# Patient Record
Sex: Male | Born: 2000 | Race: Black or African American | Hispanic: No | Marital: Single | Smoking: Never smoker
Health system: Southern US, Community
[De-identification: ages and names within clinical notes are randomized; demographics above are authoritative.]

## PROBLEM LIST (undated history)

## (undated) DIAGNOSIS — B20 Human immunodeficiency virus [HIV] disease: Secondary | ICD-10-CM

## (undated) DIAGNOSIS — J45909 Unspecified asthma, uncomplicated: Secondary | ICD-10-CM

## (undated) DIAGNOSIS — Z21 Asymptomatic human immunodeficiency virus [HIV] infection status: Secondary | ICD-10-CM

## (undated) HISTORY — DX: Asymptomatic human immunodeficiency virus (hiv) infection status: Z21

## (undated) HISTORY — DX: Human immunodeficiency virus (HIV) disease: B20

## (undated) HISTORY — DX: Unspecified asthma, uncomplicated: J45.909

---
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
## 2018-01-02 ENCOUNTER — Ambulatory Visit (INDEPENDENT_AMBULATORY_CARE_PROVIDER_SITE_OTHER): Payer: BLUE CROSS/BLUE SHIELD

## 2018-01-02 DIAGNOSIS — Z23 Encounter for immunization: Secondary | ICD-10-CM | POA: Diagnosis not present

## 2018-02-22 ENCOUNTER — Other Ambulatory Visit: Payer: Self-pay | Admitting: Infectious Diseases

## 2018-03-04 ENCOUNTER — Ambulatory Visit (INDEPENDENT_AMBULATORY_CARE_PROVIDER_SITE_OTHER): Payer: BLUE CROSS/BLUE SHIELD | Admitting: Infectious Diseases

## 2018-03-04 ENCOUNTER — Encounter: Payer: Self-pay | Admitting: Infectious Diseases

## 2018-03-04 VITALS — BP 120/76 | HR 61 | Temp 97.9°F | Wt 174.0 lb

## 2018-03-04 DIAGNOSIS — Z0000 Encounter for general adult medical examination without abnormal findings: Secondary | ICD-10-CM

## 2018-03-04 DIAGNOSIS — Z21 Asymptomatic human immunodeficiency virus [HIV] infection status: Secondary | ICD-10-CM

## 2018-03-04 DIAGNOSIS — Z23 Encounter for immunization: Secondary | ICD-10-CM | POA: Diagnosis not present

## 2018-03-04 NOTE — Assessment & Plan Note (Signed)
Jackson [name] was recently undetectable in April of this year. I congratulated him on this and offered him the opportunity to check labs today or defer until next visit - he would like to recheck them for his personal reassurance being new to HIV care which is understandable. Will check CD4 and VL today.  Counseled on adding [facility] 4[facility] (but we discussed he likely does not need one).  Discussed U=U concept in addition to safe sex counseling and prevention of other STIs. Encouraged good condom use and provided him with some today.  He will follow up again in 3 months with labs at the visit.

## 2018-03-04 NOTE — Addendum Note (Signed)
Addended by: Lurlean LeydenPOOLE, TRAVIS F on: 03/04/2018 01:58 PM   Modules accepted: Orders

## 2018-03-04 NOTE — Patient Instructions (Addendum)
If you want to add a multivitamin you can add this in the evening once a day - the only thing to be mindful of is separating by 4-6 hours from your biktarvy.   U=U for HIV sexual health.   Viral load was undetectable back in April - will recheck today. Medication is working perfectly and you are taking it very well. Keep up the good work!   CD4 count was 590 - this tells us that your immune system is doing well and is not at risk for any severe or life-threatening infections related to your HIV.   Will see you back in 3 months - if you sign up for your MyChart we can do labs at this visit so you don't need to come back for a extra lab appointment.   Please sign up with MyChart to access your labs and set up email communication with our clinic for non-urgent medical concerns.

## 2018-03-04 NOTE — Progress Notes (Signed)
Name: Jackson [name]  DOB: 08/19/2000  MRN: [id number] Patient, No Pcp Per   Patient Active Problem List   Diagnosis Date Noted  . Asthma in adult 08/23/2017  . Seasonal allergic rhinitis due to pollen 08/23/2017  . HIV (human immunodeficiency virus infection) (HCC) 08/20/2017  . Healthcare maintenance 08/20/2017  . Elevated AST (SGOT) 08/20/2017   Brief Narrative:  Jackson [name] is a 17 y.o. AA male with HIV infection. Originally diagnosed in 07/19/2017 after seeking STI testing. CD4 nadir 570. Also found to have chlamydia. Started on Biktarvy right away. History of OIs: none. HIV Risk: MSM.   Previous Regimens:   Biktarvy 2019  Genotype:   07/2017 - no mutations   SUBJECTIVE Chief Complaint  Patient presents with  . Follow-up   HPI/ROS: Jackson [name] is here wit his mother for routine follow up for his HIV. He has done well continuing to take his Biktarvy daily and missed no doses. No side effects noted. Reports no complaints today suggestive of associated opportunistic infection or advancing HIV disease such as fevers, night sweats, weight loss, anorexia, cough, SOB, nausea, vomiting, diarrhea, headache, sensory changes, lymphadenopathy or oral thrush. He has questions about sexual health going forward, vaccines and taking a multivitamin once a day, as his mother feels it is necessary. He has otherwise had no changes to his medical history and in the interval has been well. 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 His asthma has not caused him any trouble lately but has noticed some occasional "swelling of his lymph nodes around his throat" that always go back down. No other symptoms aside from mild cough or congestion at times.   Review of Systems  Constitutional: Negative for chills, fever, malaise/fatigue and weight loss.  HENT: Congestion: occasionally. Sore throat: occasionally         No dental problems.  Respiratory: Negative for  cough, sputum production, shortness of breath and wheezing.   Cardiovascular: Negative for chest pain and leg swelling.  Gastrointestinal: Negative for abdominal pain, diarrhea and vomiting.  Genitourinary: Negative for dysuria and flank pain.  Musculoskeletal: Negative for joint pain, myalgias and neck pain.  Skin: Negative for rash.  Neurological: Negative for dizziness, tingling and headaches.  Psychiatric/Behavioral: Negative for depression and substance abuse. The patient is not nervous/anxious and does not have insomnia.     Past Medical History:  Diagnosis Date  . Asthma   . HIV infection [facility])     Outpatient Medications Prior to Visit  Medication Sig Dispense Refill  . albuterol (PROVENTIL HFA;VENTOLIN HFA) 108 (90 Base) MCG/ACT inhaler Inhale 2 puffs into the lungs every 4 (four) hours as needed for wheezing or shortness of breath. 1 Inhaler 3  . BIKTARVY 50-200-25 MG TABS tablet TAKE 1 TABLET BY MOUTH DAILY. TRY TO TAKE AT THE SAME TIME EACH DAY WITH OR WITHOUT FOOD. 30 tablet 5  . cetirizine (ZYRTEC) 10 MG tablet Take 10 mg by mouth daily.    . fluticasone (FLOVENT HFA) 44 MCG/ACT inhaler Inhale 2 puffs into the lungs 2 (two) times daily. For 1-2 weeks or until breathing returns to baseline. 1 Inhaler 3  . ipratropium (ATROVENT) 0.03 % nasal spray Place 2 sprays into both nostrils 2 (two) times daily. As needed 30 mL 12   No facility-administered medications prior to visit.    Allergies  Allergen Reactions  . Amoxicillin Hives    Social History   Tobacco Use  . Smoking status:  Never Smoker  . Smokeless tobacco: Never Used  Substance Use Topics  . Alcohol use: No    Frequency: Never  . Drug use: Yes    Types: Marijuana    Family History  Problem Relation Age of Onset  . Healthy Mother     Social History   Substance and Sexual Activity  Sexual Activity Yes    OBJECTIVE: Vitals:   03/04/18 1137  BP: 120/76  Pulse: 61  Temp: 97.9 F (36.6 C)    TempSrc: Oral  Weight: 174 lb (78.9 kg)   Body mass index is 25.7 kg/m.  Physical Exam  Constitutional: He is oriented to person, place, and time and well-developed, well-nourished, and in no distress.  Pleasant young male seated comfortably in chair. Appropriately dressed and well-groomed.   HENT:  Head: Normocephalic.  Mouth/Throat: Oropharynx is clear and moist. No oral lesions. Normal dentition. No dental caries.  Eyes: Pupils are equal, round, and reactive to light. Right eye exhibits no discharge. Left eye exhibits no discharge. No scleral icterus.  Cardiovascular: Normal rate, regular rhythm and normal heart sounds.  Pulmonary/Chest: Effort normal and breath sounds normal. No respiratory distress. He has no wheezes. He has no rales.  Abdominal: Soft. He exhibits no distension. There is no tenderness.  Musculoskeletal: Normal range of motion.  Lymphadenopathy:    He has no cervical adenopathy.  Neurological: He is alert and oriented to person, place, and time.  Skin: Skin is warm and dry. No rash noted.  Psychiatric: Mood and affect normal.  Vitals reviewed.   Lab Results Lab Results  Component Value Date   WBC 7.3 07/26/2017   HGB 14.5 07/26/2017   HCT 42.4 07/26/2017   MCV 82.5 07/26/2017   PLT 319 07/26/2017    Lab Results  Component Value Date   CREATININE 1.04 09/17/2017   BUN 11 09/17/2017   NA 136 09/17/2017   K 3.9 09/17/2017   CL 102 09/17/2017   CO2 29 09/17/2017    Lab Results  Component Value Date   ALT 24 09/17/2017   AST 26 09/17/2017   BILITOT 0.8 09/17/2017    Lab Results  Component Value Date   CHOL 135 07/26/2017   HDL 32 (L) 07/26/2017   LDLCALC 85 07/26/2017   TRIG 88 07/26/2017   CHOLHDL 4.2 07/26/2017   HIV 1 RNA Quant  Date Value  11/20/2017 <20 NOT DETECTED copies/mL  09/17/2017 106 copies/mL (H)  07/26/2017 108,000 Copies/mL (H)   CD4 T Cell Abs (/uL)  Date Value  09/17/2017 590  07/26/2017 570   Lab Results   Component Value Date   HAV REACTIVE (A) 07/26/2017   Lab Results  Component Value Date   HEPBSAG NON-REACTIVE 07/26/2017   HEPBSAB NON-REACTIVE 07/26/2017   No results found for: HCVAB Lab Results  Component Value Date   CHLAMYDIAWP Negative 09/17/2017   N Negative 09/17/2017   ASSESSMENT & PLAN: Problem List Items Addressed This Visit      Other   HIV (human immunodeficiency virus infection) (HCC) - Primary (Chronic)    Leonette MostCharles was recently undetectable in April of this year. I congratulated him on this and offered him the opportunity to check labs today or defer until next visit - he would like to recheck them for his personal reassurance being new to HIV care which is understandable. Will check CD4 and VL today.  Counseled on adding [facility] 4[facility] decides [facility] (but we discussed he likely  does not need one).  Discussed U=U concept in addition to safe sex counseling and prevention of other STIs. Encouraged good condom use and provided him with some today.  He will follow up again in 3 months with labs at the visit.       Relevant Orders   HIV 1 RNA quant-no reflex-bld   T-helper cell (CD4)- ([facility] clinic only)   Healthcare maintenance    Hep B #2 today and final dose in October to complete series.  Prevnar in January 2020.  Will offer and discuss HPV at upcoming appointment to start that series of injections.         Rexene Alberts, MSN, NP-C [facility] for Infectious Disease [facility] Pager: [phone]  03/04/2018

## 2018-03-04 NOTE — Assessment & Plan Note (Signed)
Hep B #2 today and final dose in October to complete series.  Prevnar in January 2020.  Will offer and discuss HPV at upcoming appointment to start that series of injections.

## 2018-05-14 ENCOUNTER — Other Ambulatory Visit: Payer: Self-pay | Admitting: Behavioral Health

## 2018-05-14 ENCOUNTER — Other Ambulatory Visit: Payer: BLUE CROSS/BLUE SHIELD

## 2018-05-14 DIAGNOSIS — Z21 Asymptomatic human immunodeficiency virus [HIV] infection status: Secondary | ICD-10-CM

## 2018-05-28 ENCOUNTER — Ambulatory Visit (INDEPENDENT_AMBULATORY_CARE_PROVIDER_SITE_OTHER): Payer: BLUE CROSS/BLUE SHIELD | Admitting: Infectious Diseases

## 2018-05-28 ENCOUNTER — Encounter: Payer: Self-pay | Admitting: Infectious Diseases

## 2018-05-28 VITALS — BP 151/74 | HR 69 | Temp 98.6°F | Wt 165.0 lb

## 2018-05-28 DIAGNOSIS — J4520 Mild intermittent asthma, uncomplicated: Secondary | ICD-10-CM | POA: Diagnosis not present

## 2018-05-28 DIAGNOSIS — Z0000 Encounter for general adult medical examination without abnormal findings: Secondary | ICD-10-CM | POA: Diagnosis not present

## 2018-05-28 DIAGNOSIS — Z21 Asymptomatic human immunodeficiency virus [HIV] infection status: Secondary | ICD-10-CM | POA: Diagnosis not present

## 2018-05-28 NOTE — Patient Instructions (Addendum)
So nice to see you! Keep taking your Susanne Borders - will send your results on MyChart for you to see with today's labs.   We finished your hepatitis B vaccines and flu shot today.   Would like to see you back in 4 months - we can do your pneumonia vaccine and labs at this visit.   Have a good holiday!

## 2018-05-28 NOTE — Assessment & Plan Note (Signed)
[name] has reported excellent adherence with taking his Biktarvy. Was undetectable at his last office visit. Will check HIV labs today. He will continue his Biktarvy and return in 4 months. Condoms provided.

## 2018-05-28 NOTE — Progress Notes (Signed)
Name: Jackson [name]  DOB: [birth year]  MRN: [id number] Patient, No Pcp Per   Patient Active Problem List   Diagnosis Date Noted  . Asthma in adult 08/23/2017  . Seasonal allergic rhinitis due to pollen 08/23/2017  . HIV (human immunodeficiency virus infection) (HCC) 08/20/2017  . Healthcare maintenance 08/20/2017  . Elevated AST (SGOT) 08/20/2017   Brief Narrative:  Jackson [name] is a 17 y.o. AA male with HIV infection. Originally diagnosed in 07/19/2017 after seeking STI testing. CD4 nadir 570. Also found to have chlamydia. Started on Biktarvy right away. History of OIs: none. HIV Risk: MSM.   Previous Regimens:   Biktarvy 2019  Genotype:   07/2017 - no mutations   SUBJECTIVE Chief Complaint  Patient presents with  . Follow-up   HPI/ROS: Jackson [name] is here wit his mother for routine follow up for his HIV. He estimates only 1 missed dose of his Biktarvy since last office visit when he went out of town and did not take his pill box with him. He uses a pill box daily for visual reassurance he takes his medications. He is back in school now and working and doing overall very well. His asthma is under good control on current maintenance therapy. Interval history negative for illnesses, ER visits or hospitalizations. Reports no complaints today suggestive of associated opportunistic infection or advancing HIV disease such as fevers, night sweats, weight loss, anorexia, cough, SOB, nausea, vomiting, diarrhea, headache, sensory changes, lymphadenopathy or oral thrush.    Review of Systems  Constitutional: Negative for chills, fever, malaise/fatigue and weight loss.  HENT: Congestion: occasionally. Sore throat: occasionally         No dental problems.  Respiratory: Negative for cough, sputum production, shortness of breath and wheezing.   Cardiovascular: Negative for chest pain and leg swelling.  Gastrointestinal: Negative for abdominal pain, diarrhea and vomiting.  Genitourinary: Negative for  dysuria and flank pain.  Musculoskeletal: Negative for joint pain, myalgias and neck pain.  Skin: Negative for rash.  Neurological: Negative for dizziness, tingling and headaches.  Psychiatric/Behavioral: Negative for depression and substance abuse. The patient is not nervous/anxious and does not have insomnia.     Past Medical History:  Diagnosis Date  . Asthma   . HIV infection [facility])     Outpatient Medications Prior to Visit  Medication Sig Dispense Refill  . albuterol (PROVENTIL HFA;VENTOLIN HFA) 108 (90 Base) MCG/ACT inhaler Inhale 2 puffs into the lungs every 4 (four) hours as needed for wheezing or shortness of breath. 1 Inhaler 3  . BIKTARVY 50-200-25 MG TABS tablet TAKE 1 TABLET BY MOUTH DAILY. TRY TO TAKE AT THE SAME TIME EACH DAY WITH OR WITHOUT FOOD. 30 tablet 5  . cetirizine (ZYRTEC) 10 MG tablet Take 10 mg by mouth daily.    . fluticasone (FLOVENT HFA) 44 MCG/ACT inhaler Inhale 2 puffs into the lungs 2 (two) times daily. For 1-2 weeks or until breathing returns to baseline. 1 Inhaler 3  . ipratropium (ATROVENT) 0.03 % nasal spray Place 2 sprays into both nostrils 2 (two) times daily. As needed 30 mL 12   No facility-administered medications prior to visit.    Allergies  Allergen Reactions  . Amoxicillin Hives    Social History   Tobacco Use  . Smoking status: Never Smoker  . Smokeless tobacco: Never Used  Substance Use Topics  . Alcohol use: No    Frequency: Never  . Drug use: Yes    Types: Marijuana    Family History  Problem Relation Age of Onset  . Healthy Mother     Social History   Substance and Sexual Activity  Sexual Activity Yes    OBJECTIVE: Vitals:   05/28/18 1612  BP: (!) 151/74  Pulse: 69  Temp: 98.6 F (37 C)  TempSrc: Oral  Weight: 165 lb (74.8 kg)   Body mass index is 24.37 kg/m.  Physical Exam  Constitutional: He is oriented to person, place, and time and well-developed, well-nourished, and in no distress.  Pleasant young  male seated comfortably in chair. Appropriately dressed and well-groomed.   HENT:  Head: Normocephalic.  Mouth/Throat: Oropharynx is clear and moist. No oral lesions. Normal dentition. No dental caries.  Eyes: Pupils are equal, round, and reactive to light. Right eye exhibits no discharge. Left eye exhibits no discharge. No scleral icterus.  Cardiovascular: Normal rate, regular rhythm and normal heart sounds.  Pulmonary/Chest: Effort normal and breath sounds normal. No respiratory distress. He has no wheezes. He has no rales.  Abdominal: Soft. He exhibits no distension. There is no tenderness.  Musculoskeletal: Normal range of motion.  Lymphadenopathy:    He has no cervical adenopathy.  Neurological: He is alert and oriented to person, place, and time.  Skin: Skin is warm and dry. No rash noted.  Psychiatric: Mood and affect normal.  Vitals reviewed.   Lab Results Lab Results  Component Value Date   WBC 7.3 07/26/2017   HGB 14.5 07/26/2017   HCT 42.4 07/26/2017   MCV 82.5 07/26/2017   PLT 319 07/26/2017    Lab Results  Component Value Date   CREATININE 1.04 09/17/2017   BUN 11 09/17/2017   NA 136 09/17/2017   K 3.9 09/17/2017   CL 102 09/17/2017   CO2 29 09/17/2017    Lab Results  Component Value Date   ALT 24 09/17/2017   AST 26 09/17/2017   BILITOT 0.8 09/17/2017    Lab Results  Component Value Date   CHOL 135 07/26/2017   HDL 32 (L) 07/26/2017   LDLCALC 85 07/26/2017   TRIG 88 07/26/2017   CHOLHDL 4.2 07/26/2017   HIV 1 RNA Quant  Date Value  11/20/2017 <20 NOT DETECTED copies/mL  09/17/2017 106 copies/mL (H)  07/26/2017 108,000 Copies/mL (H)   CD4 T Cell Abs (/uL)  Date Value  09/17/2017 590  07/26/2017 570   Lab Results  Component Value Date   HAV REACTIVE (A) 07/26/2017   Lab Results  Component Value Date   HEPBSAG NON-REACTIVE 07/26/2017   HEPBSAB NON-REACTIVE 07/26/2017   No results found for: HCVAB Lab Results  Component Value Date    CHLAMYDIAWP Negative 09/17/2017   N Negative 09/17/2017   ASSESSMENT & PLAN: Problem List Items Addressed This Visit      Unprioritized   Asthma in adult    Under good control - managed by his PCP.       Healthcare maintenance - Primary    Flu shot today. Hep B #3 today. Will recheck HBsAb next lab draw in 59m. Will give Prevnar at next appt to complete recommended vaccines for [facility] in accordance with his age.       HIV (human immunodeficiency virus infection) (HCC) (Chronic)    Jackson [name] has reported excellent adherence with taking his Biktarvy. Was undetectable at his last office visit. Will check HIV labs today. He will continue his Biktarvy and return in 4 months. Condoms provided.         No orders of the defined types were  placed in this encounter.  Return in about 4 months (around 09/28/2018).   Jackson Alberts, MSN, NP-C [facility] for Infectious Disease [facility] Pager: [phone]  05/28/2018

## 2018-05-28 NOTE — Assessment & Plan Note (Signed)
Flu shot today. Hep B #3 today. Will recheck HBsAb next lab draw in 41m. Will give Prevnar at next appt to complete recommended vaccines for [facility] in accordance with his age.

## 2018-05-28 NOTE — Assessment & Plan Note (Signed)
Under good control - managed by his PCP.

## 2018-05-30 LAB — CBC WITH DIFFERENTIAL/PLATELET
Basophils Absolute: 18 cells/uL (ref 0–200)
Basophils Relative: 0.3 %
EOS PCT: 0.7 %
Eosinophils Absolute: 43 cells/uL (ref 15–500)
HCT: 43 % (ref 38.5–50.0)
Hemoglobin: 14.7 g/dL (ref 13.2–17.1)
Lymphs Abs: 2263 cells/uL (ref 850–3900)
MCH: 29.9 pg (ref 27.0–33.0)
MCHC: 34.2 g/dL (ref 32.0–36.0)
MCV: 87.6 fL (ref 80.0–100.0)
MPV: 10.5 fL (ref 7.5–12.5)
Monocytes Relative: 7.8 %
NEUTROS PCT: 54.1 %
Neutro Abs: 3300 cells/uL (ref 1500–7800)
Platelets: 272 10*3/uL (ref 140–400)
RBC: 4.91 10*6/uL (ref 4.20–5.80)
RDW: 12.5 % (ref 11.0–15.0)
Total Lymphocyte: 37.1 %
WBC mixed population: 476 cells/uL (ref 200–950)
WBC: 6.1 10*3/uL (ref 3.8–10.8)

## 2018-05-30 LAB — T-HELPER CELL (CD4) - (RCID CLINIC ONLY)
CD4 T CELL ABS: 700 /uL (ref 400–2700)
CD4 T CELL HELPER: 31 % — AB (ref 33–55)

## 2018-05-30 LAB — COMPREHENSIVE METABOLIC PANEL
AG RATIO: 1.2 (calc) (ref 1.0–2.5)
ALBUMIN MSPROF: 4.3 g/dL (ref 3.6–5.1)
ALKALINE PHOSPHATASE (APISO): 87 U/L (ref 48–230)
ALT: 19 U/L (ref 8–46)
AST: 19 U/L (ref 12–32)
BILIRUBIN TOTAL: 0.6 mg/dL (ref 0.2–1.1)
BUN: 12 mg/dL (ref 7–20)
CALCIUM: 9.6 mg/dL (ref 8.9–10.4)
CO2: 28 mmol/L (ref 20–32)
Chloride: 104 mmol/L (ref 98–110)
Creat: 1.04 mg/dL (ref 0.60–1.26)
GLOBULIN: 3.5 g/dL (ref 2.1–3.5)
Glucose, Bld: 103 mg/dL — ABNORMAL HIGH (ref 65–99)
POTASSIUM: 3.8 mmol/L (ref 3.8–5.1)
Sodium: 138 mmol/L (ref 135–146)
Total Protein: 7.8 g/dL (ref 6.3–8.2)

## 2018-05-30 LAB — HIV-1 RNA QUANT-NO REFLEX-BLD
HIV 1 RNA Quant: <20 copies/mL
HIV-1 RNA Quant, Log: <1.3 Log copies/mL

## 2018-07-04 ENCOUNTER — Ambulatory Visit: Payer: BLUE CROSS/BLUE SHIELD

## 2018-07-10 ENCOUNTER — Ambulatory Visit: Payer: BLUE CROSS/BLUE SHIELD

## 2018-08-22 ENCOUNTER — Other Ambulatory Visit: Payer: Self-pay

## 2018-08-22 MED ORDER — BICTEGRAVIR-EMTRICITAB-TENOFOV 50-200-25 MG PO TABS: ORAL_TABLET | ORAL | 0 refills | Status: DC

## 2018-09-21 ENCOUNTER — Other Ambulatory Visit: Payer: Self-pay | Admitting: Infectious Diseases

## 2018-10-01 ENCOUNTER — Ambulatory Visit (INDEPENDENT_AMBULATORY_CARE_PROVIDER_SITE_OTHER): Payer: BLUE CROSS/BLUE SHIELD | Admitting: Family Medicine

## 2018-10-01 ENCOUNTER — Encounter (INDEPENDENT_AMBULATORY_CARE_PROVIDER_SITE_OTHER): Payer: Self-pay | Admitting: Family Medicine

## 2018-10-01 ENCOUNTER — Ambulatory Visit (INDEPENDENT_AMBULATORY_CARE_PROVIDER_SITE_OTHER): Payer: Self-pay

## 2018-10-01 DIAGNOSIS — M25562 Pain in left knee: Secondary | ICD-10-CM | POA: Diagnosis not present

## 2018-10-01 DIAGNOSIS — G8929 Other chronic pain: Secondary | ICD-10-CM

## 2018-10-01 MED ORDER — MELOXICAM 15 MG PO TABS: 7.5000 mg | ORAL_TABLET | Freq: Every day | ORAL | 6 refills | Status: DC | PRN

## 2018-10-01 NOTE — Progress Notes (Signed)
   Office Visit Note   Patient: Jackson [name]           Date of Birth: Aug 05, 2001           MRN: [id number] Visit Date: 10/01/2018 Requested by: No referring provider defined for this encounter. PCP: Patient, No Pcp Per  Subjective: Chief Complaint  Patient presents with  . Left Knee - Pain    Pain over the years with playing sports. Pain posterior knee x 3-4 days. NKI, but has been working out more lately.     HPI: He's here with left knee pain.  Chronic intermittent pain with sports, but acutely worse in past week.  No recent injury.  Posterior pain, lateral pain, and swelling.  Ibuprofen not helping.  No locking/giving way.  No family history of rheumatologic disease.              ROS: Has HIV.  Otherwise in good health.  Objective: Vital Signs: There were no vitals taken for this visit.  Physical Exam:  Left knee:  2+ effusion, no warmth/erythema.  Lachman's solid; no laxity with varus/valgus stress.  Tender lateral joint line with pain and click on McMurray's.  Fullness in popliteal fossa, possible cyst.  Imaging: X-rays left knee:  Growth plates closed, no sign of loose body or OCD.    Assessment & Plan: 1.  Chronic left knee pain, suspicious for lateral meniscus tear. - MRI to look for repairable tear.  Try meloxicam.   Follow-Up Instructions: No follow-ups on file.      Procedures: No procedures performed  No notes on file    PMFS History: Patient Active Problem List   Diagnosis Date Noted  . Asthma in adult 08/23/2017  . Seasonal allergic rhinitis due to pollen 08/23/2017  . HIV (human immunodeficiency virus infection) (HCC) 08/20/2017  . Healthcare maintenance 08/20/2017  . Elevated AST (SGOT) 08/20/2017   Past Medical History:  Diagnosis Date  . Asthma   . HIV infection (HCC)     Family History  Problem Relation Age of Onset  . Healthy Mother     History reviewed. No pertinent surgical history. Social History   Occupational History  . Not  on file  Tobacco Use  . Smoking status: Never Smoker  . Smokeless tobacco: Never Used  Substance and Sexual Activity  . Alcohol use: No    Frequency: Never  . Drug use: Yes    Types: Marijuana  . Sexual activity: Yes

## 2018-10-14 ENCOUNTER — Encounter: Payer: Self-pay | Admitting: Infectious Diseases

## 2018-10-14 ENCOUNTER — Other Ambulatory Visit (HOSPITAL_COMMUNITY)
Admission: RE | Admit: 2018-10-14 | Discharge: 2018-10-14 | Disposition: A | Discharge status: Home / Self Care | Payer: BLUE CROSS/BLUE SHIELD | Source: Ambulatory Visit | Attending: Infectious Diseases | Admitting: Infectious Diseases

## 2018-10-14 ENCOUNTER — Ambulatory Visit (INDEPENDENT_AMBULATORY_CARE_PROVIDER_SITE_OTHER): Payer: BLUE CROSS/BLUE SHIELD | Admitting: Infectious Diseases

## 2018-10-14 VITALS — BP 117/75 | HR 78 | Temp 98.7°F | Ht 69.0 in | Wt 185.0 lb

## 2018-10-14 DIAGNOSIS — Z113 Encounter for screening for infections with a predominantly sexual mode of transmission: Secondary | ICD-10-CM | POA: Insufficient documentation

## 2018-10-14 DIAGNOSIS — Z0000 Encounter for general adult medical examination without abnormal findings: Secondary | ICD-10-CM | POA: Insufficient documentation

## 2018-10-14 DIAGNOSIS — Z21 Asymptomatic human immunodeficiency virus [HIV] infection status: Secondary | ICD-10-CM | POA: Insufficient documentation

## 2018-10-14 DIAGNOSIS — Z23 Encounter for immunization: Secondary | ICD-10-CM

## 2018-10-14 DIAGNOSIS — Z79899 Other long term (current) drug therapy: Secondary | ICD-10-CM

## 2018-10-14 DIAGNOSIS — J4520 Mild intermittent asthma, uncomplicated: Secondary | ICD-10-CM

## 2018-10-14 MED ORDER — BICTEGRAVIR-EMTRICITAB-TENOFOV 50-200-25 MG PO TABS: 1.0000 | ORAL_TABLET | Freq: Every day | ORAL | 3 refills | Status: DC

## 2018-10-14 NOTE — Assessment & Plan Note (Signed)
Jackson [name] is doing very well on his Biktarvy. He has had some weight gain since last visit 4.5 months ago which may be associated with the bictegravir but in discussion with him it seems very multifactorial (college student, increased fast food, late night eating, less exercise, etc).  Will continue for him. Check labs today.  Return to clinic in 48m.

## 2018-10-14 NOTE — Addendum Note (Signed)
Addended by: Gildardo Griffes on: 10/14/2018 05:15 PM   Modules accepted: Orders

## 2018-10-14 NOTE — Patient Instructions (Addendum)
Very nice to see you today!  Please continue taking your Biktarvy once a day as you have been doing. Will release your results on MyChart for you to see.   Vaccines given today   Hepatitis B #3 (last one!)   Prevnar (pneumonia - next one due 2024)  Flu vaccine  It takes 2 weeks for the flu vaccine to start working - if you have sudden fevers later today or tomorrow please call/send MyChart and will send in your medication as discussed. I would suspect your risk for getting the flu from your family to be very low based on what you told me.   Will see you back in 4 months - can do labs at the visit if you like since you are on MyChart.   Stay Well! Remember allergy season is upon [place]. If you find you are more congested or runny eyes/cough would encourage you to start a zyrtec or claritin once a day.

## 2018-10-14 NOTE — Progress Notes (Signed)
Name: Jackson [name]  DOB: 04/08/2001  MRN: [id number] Patient, No Pcp Per   Patient Active Problem List   Diagnosis Date Noted  . Screen for STD (sexually transmitted disease) 10/14/2018  . Asthma in adult 08/23/2017  . Seasonal allergic rhinitis due to pollen 08/23/2017  . HIV (human immunodeficiency virus infection) (HCC) 08/20/2017  . Healthcare maintenance 08/20/2017  . Elevated AST (SGOT) 08/20/2017   Brief Narrative:  Jackson [name] is a 18 y.o. AA male with HIV infection. Originally diagnosed in 07/19/2017 after seeking STI testing. CD4 nadir 570. Also found to have chlamydia. Started on Biktarvy right away. History of OIs: none. HIV Risk: MSM.   Previous Regimens:   Biktarvy 2019  Genotype:   07/2017 - no mutations   SUBJECTIVE Chief Complaint  Patient presents with  . Follow-up   CC:  HIV follow up care. Weight gain 20#'s since October.    HPI:  Jackson [name] is doing well overall. 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 He reports nearly 100% adherence to biktarvy (missed maybe 2 doses since last visit) but has implemented measures to prevent this from happening again. Only change to medical history includes work up for meniscal tear in his knee - he has an MRI appointment tomorrow pending.   His mom and brother have the flu - he had contact with them 2 nearly 3 days ago now and he has no symptoms. He is a little late on his last Hep B vaccine and requesting this today.   Review of Systems  Constitutional: Negative for chills, fever, malaise/fatigue and weight loss.  HENT: Congestion: occasionally. Sore throat: occasionally         No dental problems.  Respiratory: Negative for cough, sputum production, shortness of breath and wheezing.   Cardiovascular: Negative for chest pain and leg swelling.  Gastrointestinal: Negative for abdominal pain, diarrhea and  vomiting.  Genitourinary: Negative for dysuria and flank pain.  Musculoskeletal: Negative for joint pain, myalgias and neck pain.  Skin: Negative for rash.  Neurological: Negative for dizziness, tingling and headaches.  Psychiatric/Behavioral: Negative for depression and substance abuse. The patient is not nervous/anxious and does not have insomnia.     Past Medical History:  Diagnosis Date  . Asthma   . HIV infection [facility])     Outpatient Medications Prior to Visit  Medication Sig Dispense Refill  . albuterol (PROVENTIL HFA;VENTOLIN HFA) 108 (90 Base) MCG/ACT inhaler Inhale 2 puffs into the lungs every 4 (four) hours as needed for wheezing or shortness of breath. 1 Inhaler 3  . cetirizine (ZYRTEC) 10 MG tablet Take 10 mg by mouth daily.    . fluticasone (FLOVENT HFA) 44 MCG/ACT inhaler Inhale 2 puffs into the lungs 2 (two) times daily. For 1-2 weeks or until breathing returns to baseline. 1 Inhaler 3  . ipratropium (ATROVENT) 0.03 % nasal spray Place 2 sprays into both nostrils 2 (two) times daily. As needed 30 mL 12  . meloxicam (MOBIC) 15 MG tablet Take 0.5-1 tablets (7.5-15 mg total) by mouth daily as needed for pain. 30 tablet 6  . BIKTARVY 50-200-25 MG TABS tablet TAKE 1 TABLET BY MOUTH DAILY. TRY TO TAKE AT THE SAME TIME EACH DAY WITH OR WITHOUT FOOD. 30 tablet 2   No facility-administered medications prior to visit.    Allergies  Allergen Reactions  . Amoxicillin Hives  Social History   Tobacco Use  . Smoking status: Never Smoker  . Smokeless tobacco: Never Used  Substance Use Topics  . Alcohol use: No    Frequency: Never  . Drug use: Yes    Frequency: 7.0 times per week    Types: Marijuana    Family History  Problem Relation Age of Onset  . Healthy Mother     Social History   Substance and Sexual Activity  Sexual Activity Yes   Comment: given condoms    OBJECTIVE: Vitals:   10/14/18 1621  BP: 117/75  Pulse: 78  Temp: 98.7 F (37.1 C)  TempSrc:  Oral  Weight: 185 lb (83.9 kg)  Height: 5\' 9"  (1.753 m)   Body mass index is 27.32 kg/m.  Physical Exam  Constitutional: He is oriented to person, place, and time and well-developed, well-nourished, and in no distress.  Pleasant young male seated comfortably in chair. Appears well today.   HENT:  Head: Normocephalic.  Mouth/Throat: Oropharynx is clear and moist. No oral lesions. Normal dentition. No dental caries.  Eyes: Pupils are equal, round, and reactive to light. Right eye exhibits no discharge. Left eye exhibits no discharge. No scleral icterus.  Cardiovascular: Normal rate, regular rhythm and normal heart sounds.  Pulmonary/Chest: Effort normal and breath sounds normal. No respiratory distress. He has no wheezes. He has no rales.  Abdominal: Soft. He exhibits no distension. There is no abdominal tenderness.  Musculoskeletal: Normal range of motion.  Lymphadenopathy:    He has no cervical adenopathy.  Neurological: He is alert and oriented to person, place, and time.  Skin: Skin is warm and dry. No rash noted.  Psychiatric: Mood and affect normal.  Vitals reviewed.   Lab Results Lab Results  Component Value Date   WBC 6.1 05/28/2018   HGB 14.7 05/28/2018   HCT 43.0 05/28/2018   MCV 87.6 05/28/2018   PLT 272 05/28/2018    Lab Results  Component Value Date   CREATININE 1.04 05/28/2018   BUN 12 05/28/2018   NA 138 05/28/2018   K 3.8 05/28/2018   CL 104 05/28/2018   CO2 28 05/28/2018    Lab Results  Component Value Date   ALT 19 05/28/2018   AST 19 05/28/2018   BILITOT 0.6 05/28/2018    Lab Results  Component Value Date   CHOL 135 07/26/2017   HDL 32 (L) 07/26/2017   LDLCALC 85 07/26/2017   TRIG 88 07/26/2017   CHOLHDL 4.2 07/26/2017   HIV 1 RNA Quant (copies/mL)  Date Value  05/28/2018 <20 NOT DETECTED  11/20/2017 <20 NOT DETECTED  09/17/2017 106 (H)   CD4 T Cell Abs (/uL)  Date Value  05/28/2018 700  09/17/2017 590  07/26/2017 570   Lab  Results  Component Value Date   HAV REACTIVE (A) 07/26/2017   Lab Results  Component Value Date   HEPBSAG NON-REACTIVE 07/26/2017   HEPBSAB NON-REACTIVE 07/26/2017   No results found for: HCVAB Lab Results  Component Value Date   CHLAMYDIAWP Negative 09/17/2017   N Negative 09/17/2017   ASSESSMENT & PLAN: Problem List Items Addressed This Visit      Unprioritized   Asthma in adult    Well controlled. Advised if he has increase in nasal congestion/cough/runny eyes to start zyrtec vs claritin once daily with flonase. He rarely uses his inhaler anymore.       Healthcare maintenance    Prevnar, Hep B #3 and Flu shot today.  He will be  due for Pneumovax in 2024 and tetanus booster 2021.  Otherwise up to date for vaccines recommended for [facility].        HIV (human immunodeficiency virus infection) (HCC) - Primary (Chronic)    Angelina is doing very well on his Biktarvy. He has had some weight gain since last visit 4.5 months ago which may be associated with the bictegravir but in discussion with him it seems very multifactorial (college student, increased fast food, late night eating, less exercise, etc).  Will continue for him. Check labs today.  Return to clinic in 81m.       Relevant Medications   bictegravir-emtricitabine-tenofovir AF (BIKTARVY) 50-200-25 MG TABS tablet   Other Relevant Orders   HIV-1 RNA quant-no reflex-bld   COMPLETE METABOLIC PANEL WITH GFR   CBC with Differential/Platelet   T-helper cell (CD4)- ([facility] clinic only)   Screen for STD (sexually transmitted disease)    Rectal and urine GC/C today as well as serum RPR. Declined oral swab.       Relevant Orders   RPR   Cytology (oral, anal, urethral) ancillary only   Urine cytology ancillary only    Other Visit Diagnoses    Need for hepatitis B vaccination       Need for immunization against influenza       Need for vaccination against Streptococcus pneumoniae using pneumococcal conjugate vaccine 13        Encounter for long-term (current) use of high-risk medication       Relevant Orders   Lipid panel      Return in about 4 months (around 02/13/2019).   Rexene Alberts, MSN, NP-C [facility] Pager: [phone]  10/14/2018

## 2018-10-14 NOTE — Assessment & Plan Note (Signed)
Prevnar, Hep B #3 and Flu shot today.  He will be due for Pneumovax in 2024 and tetanus booster 2021.  Otherwise up to date for vaccines recommended for [facility].

## 2018-10-14 NOTE — Assessment & Plan Note (Signed)
Rectal and urine GC/C today as well as serum RPR. Declined oral swab.

## 2018-10-14 NOTE — Assessment & Plan Note (Signed)
Well controlled. Advised if he has increase in nasal congestion/cough/runny eyes to start zyrtec vs claritin once daily with flonase. He rarely uses his inhaler anymore.

## 2018-10-15 ENCOUNTER — Ambulatory Visit
Admission: RE | Admit: 2018-10-15 | Discharge: 2018-10-15 | Disposition: A | Discharge status: Home / Self Care | Payer: BLUE CROSS/BLUE SHIELD | Source: Ambulatory Visit | Attending: Family Medicine | Admitting: Family Medicine

## 2018-10-15 DIAGNOSIS — M23342 Other meniscus derangements, anterior horn of lateral meniscus, left knee: Secondary | ICD-10-CM | POA: Diagnosis not present

## 2018-10-15 DIAGNOSIS — M25562 Pain in left knee: Principal | ICD-10-CM

## 2018-10-15 DIAGNOSIS — G8929 Other chronic pain: Secondary | ICD-10-CM

## 2018-10-16 ENCOUNTER — Telehealth (INDEPENDENT_AMBULATORY_CARE_PROVIDER_SITE_OTHER): Payer: Self-pay | Admitting: Family Medicine

## 2018-10-16 LAB — CBC WITH DIFFERENTIAL/PLATELET
Absolute Monocytes: 790 cells/uL (ref 200–950)
Basophils Absolute: 30 cells/uL (ref 0–200)
Basophils Relative: 0.3 %
Eosinophils Absolute: 30 cells/uL (ref 15–500)
Eosinophils Relative: 0.3 %
HCT: 43.6 % (ref 38.5–50.0)
Hemoglobin: 15.3 g/dL (ref 13.2–17.1)
Lymphs Abs: 1820 cells/uL (ref 850–3900)
MCH: 30.6 pg (ref 27.0–33.0)
MCHC: 35.1 g/dL (ref 32.0–36.0)
MCV: 87.2 fL (ref 80.0–100.0)
MPV: 10.1 fL (ref 7.5–12.5)
Monocytes Relative: 7.9 %
Neutro Abs: 7330 cells/uL (ref 1500–7800)
Neutrophils Relative %: 73.3 %
Platelets: 265 10*3/uL (ref 140–400)
RBC: 5 10*6/uL (ref 4.20–5.80)
RDW: 12.7 % (ref 11.0–15.0)
Total Lymphocyte: 18.2 %
WBC: 10 10*3/uL (ref 3.8–10.8)

## 2018-10-16 LAB — T-HELPER CELL (CD4) - (RCID CLINIC ONLY)
CD4 % Helper T Cell: 30 % — ABNORMAL LOW (ref 33–55)
CD4 T Cell Abs: 590 /uL (ref 400–2700)

## 2018-10-16 LAB — COMPLETE METABOLIC PANEL WITH GFR
AG RATIO: 1.2 (calc) (ref 1.0–2.5)
ALT: 32 U/L (ref 8–46)
AST: 38 U/L — ABNORMAL HIGH (ref 12–32)
Albumin: 4.5 g/dL (ref 3.6–5.1)
Alkaline phosphatase (APISO): 79 U/L (ref 46–169)
BUN: 11 mg/dL (ref 7–20)
CO2: 28 mmol/L (ref 20–32)
Calcium: 9.6 mg/dL (ref 8.9–10.4)
Chloride: 102 mmol/L (ref 98–110)
Creat: 1.12 mg/dL (ref 0.60–1.26)
GFR, EST AFRICAN AMERICAN: 110 mL/min/{1.73_m2} (ref >=60)
GFR, EST NON AFRICAN AMERICAN: 95 mL/min/{1.73_m2} (ref >=60)
GLOBULIN: 3.7 g/dL — AB (ref 2.1–3.5)
Glucose, Bld: 93 mg/dL (ref 65–99)
Potassium: 3.8 mmol/L (ref 3.8–5.1)
Sodium: 138 mmol/L (ref 135–146)
Total Bilirubin: 1.3 mg/dL — ABNORMAL HIGH (ref 0.2–1.1)
Total Protein: 8.2 g/dL (ref 6.3–8.2)

## 2018-10-16 LAB — HIV-1 RNA QUANT-NO REFLEX-BLD
HIV 1 RNA QUANT: DETECTED {copies}/mL — AB
HIV-1 RNA Quant, Log: <1.3 Log copies/mL — AB

## 2018-10-16 LAB — LIPID PANEL
Cholesterol: 154 mg/dL (ref <170)
HDL: 57 mg/dL (ref >45)
LDL Cholesterol (Calc): 80 mg/dL (calc) (ref <110)
Non-HDL Cholesterol (Calc): 97 mg/dL (calc) (ref <120)
Total CHOL/HDL Ratio: 2.7 (calc) (ref <5.0)
Triglycerides: 89 mg/dL (ref <90)

## 2018-10-16 LAB — RPR: RPR Ser Ql: NONREACTIVE

## 2018-10-16 NOTE — Telephone Encounter (Signed)
MRI shows a possible small tear in the lateral meniscus cartilage.  It is not definite, but it is certainly in the location of his pain.  Treatment options would include trying physical therapy, or possibly consulting with SD for consideration of knee scope.

## 2018-10-17 ENCOUNTER — Other Ambulatory Visit: Payer: Self-pay

## 2018-10-17 ENCOUNTER — Telehealth: Payer: Self-pay

## 2018-10-17 DIAGNOSIS — A749 Chlamydial infection, unspecified: Secondary | ICD-10-CM

## 2018-10-17 LAB — CYTOLOGY, (ORAL, ANAL, URETHRAL) ANCILLARY ONLY
Chlamydia: NEGATIVE
Chlamydia: POSITIVE — AB
NEISSERIA GONORRHEA: NEGATIVE
Neisseria Gonorrhea: NEGATIVE

## 2018-10-17 LAB — URINE CYTOLOGY ANCILLARY ONLY
Chlamydia: POSITIVE — AB
Neisseria Gonorrhea: NEGATIVE

## 2018-10-17 MED ORDER — AZITHROMYCIN 250 MG PO TABS: ORAL_TABLET | ORAL | 0 refills | Status: DC

## 2018-10-17 NOTE — Telephone Encounter (Signed)
-----   Message from Blanchard Kelch, NP sent at 10/17/2018  2:54 PM EST ----- Patient + Chlamydia bacteria in his throat and rectum - please have him come in for treatment with 1 gm PO Azithromycin x 1. He should notify previous partners to seek care for testing/treatment. Avoid sex for a week to avoid re-infection.   Thank you

## 2018-10-17 NOTE — Telephone Encounter (Signed)
Called Jackson [name] and verified idenity, per Rexene Alberts NP., With results.   Patient asked about ways that Chlamydia is spread.   Upon explaining, patient had no further questions or concerns.  Appointment for treatment scheduled for Monday 10/21/2018 at 11:30 am.   Gerarda Fraction, CMA

## 2018-10-17 NOTE — Telephone Encounter (Signed)
Excellent thank you very much for discussing.

## 2018-10-17 NOTE — Progress Notes (Signed)
Patient + Chlamydia bacteria in his throat and rectum - please have him come in for treatment with 1 gm PO Azithromycin x 1. He should notify previous partners to seek care for testing/treatment. Avoid sex for a week to avoid re-infection.   Thank you

## 2018-10-21 ENCOUNTER — Ambulatory Visit: Payer: BLUE CROSS/BLUE SHIELD

## 2018-11-08 ENCOUNTER — Telehealth (INDEPENDENT_AMBULATORY_CARE_PROVIDER_SITE_OTHER): Payer: Self-pay | Admitting: Radiology

## 2018-11-08 NOTE — Telephone Encounter (Signed)
Called patient and left voicemail asking patient to call back in regards to his appointment on 3/30

## 2018-11-11 ENCOUNTER — Ambulatory Visit (INDEPENDENT_AMBULATORY_CARE_PROVIDER_SITE_OTHER): Payer: BLUE CROSS/BLUE SHIELD | Admitting: Orthopedic Surgery

## 2018-11-13 ENCOUNTER — Ambulatory Visit (INDEPENDENT_AMBULATORY_CARE_PROVIDER_SITE_OTHER): Payer: BLUE CROSS/BLUE SHIELD | Admitting: Orthopedic Surgery

## 2018-11-13 ENCOUNTER — Encounter (INDEPENDENT_AMBULATORY_CARE_PROVIDER_SITE_OTHER): Payer: Self-pay | Admitting: Orthopedic Surgery

## 2018-11-13 ENCOUNTER — Ambulatory Visit (INDEPENDENT_AMBULATORY_CARE_PROVIDER_SITE_OTHER): Payer: Self-pay

## 2018-11-13 DIAGNOSIS — M25571 Pain in right ankle and joints of right foot: Secondary | ICD-10-CM | POA: Diagnosis not present

## 2018-11-13 DIAGNOSIS — S838X2D Sprain of other specified parts of left knee, subsequent encounter: Secondary | ICD-10-CM | POA: Diagnosis not present

## 2018-11-13 NOTE — Progress Notes (Signed)
Office Visit Note   Patient: Jackson [name]           Date of Birth: Feb 15, 2001           MRN: [id number] Visit Date: 11/13/2018 Requested by: No referring provider defined for this encounter. PCP: Patient, No Pcp Per  Subjective: Chief Complaint  Patient presents with  . 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  Initially describe some swelling in the knee with some mechanical symptoms but it has improved with diminished activity over the past 6 weeks.  MRI scan is reviewed with the patient.  They suggest a small radial tear in the meniscus.  I think he may have potentially more significant vertical tear at the meniscocapsular junction on that lateral side.              ROS: All systems reviewed are negative as they relate to the chief complaint within the history of present illness.  Patient denies  fevers or chills.   Assessment & Plan: Visit Diagnoses:  1. Pain in right ankle and joints of right foot   2. Injury of meniscus of left knee, subsequent encounter     Plan: Impression is left knee possible lateral meniscal pathology which is not definitive on MRI scanning.  Currently not doing elective surgery.  I would favor 1 month return to full activity for this patient so we can see if the knee swells or becomes more symptomatic.  If so he will likely be heading for arthroscopic evaluation and possible meniscal debridement versus repair.  I will see him back in a month and we can decide for or against arthroscopic intervention then.  Follow-Up Instructions: Return in about 4 weeks (around 12/11/2018).   Orders:  Orders Placed This Encounter  Procedures  . XR Ankle Complete Right   No orders of the defined types were placed in this encounter.     Procedures: No procedures performed   Clinical Data: No additional findings.  Objective: Vital Signs: There were  no vitals taken for this visit.  Physical Exam:   Constitutional: Patient appears well-developed HEENT:  Head: Normocephalic Eyes:EOM are normal Neck: Normal range of motion Cardiovascular: Normal rate Pulmonary/chest: Effort normal Neurologic: Patient is alert Skin: Skin is warm Psychiatric: Patient has normal mood and affect    Ortho Exam: Ortho exam demonstrates normal gait alignment with trace effusion left knee full range of motion stable collateral cruciate ligaments palpable pedal pulses and only mild lateral joint line tenderness.  Extensor mechanism is intact and nontender  Specialty Comments:  No specialty comments available.  Imaging: No results found.   PMFS History: Patient Active Problem List   Diagnosis Date Noted  . Screen for STD (sexually transmitted disease) 10/14/2018  . Asthma in adult 08/23/2017  . Seasonal allergic rhinitis due to pollen 08/23/2017  . HIV (human immunodeficiency virus infection) (HCC) 08/20/2017  . Healthcare maintenance 08/20/2017  . Elevated AST (SGOT) 08/20/2017   Past Medical History:  Diagnosis Date  . Asthma   . HIV infection (HCC)     Family History  Problem Relation Age of Onset  . Healthy Mother     History reviewed. No pertinent surgical history. Social History   Occupational History  . Not on file  Tobacco Use  . Smoking status: Never Smoker  . Smokeless tobacco: Never Used  Substance  and Sexual Activity  . Alcohol use: No    Frequency: Never  . Drug use: Yes    Frequency: 7.0 times per week    Types: Marijuana  . Sexual activity: Yes    Comment: given condoms

## 2018-12-11 ENCOUNTER — Ambulatory Visit (INDEPENDENT_AMBULATORY_CARE_PROVIDER_SITE_OTHER): Payer: BLUE CROSS/BLUE SHIELD | Admitting: Orthopedic Surgery

## 2018-12-11 ENCOUNTER — Encounter (INDEPENDENT_AMBULATORY_CARE_PROVIDER_SITE_OTHER): Payer: Self-pay | Admitting: Orthopedic Surgery

## 2018-12-11 ENCOUNTER — Other Ambulatory Visit: Payer: Self-pay

## 2018-12-11 DIAGNOSIS — S838X2D Sprain of other specified parts of left knee, subsequent encounter: Secondary | ICD-10-CM

## 2018-12-11 NOTE — Progress Notes (Signed)
Office Visit Note   Patient: Jackson [name]           Date of Birth: 08/25/2000           MRN: [id number] Visit Date: 12/11/2018 Requested by: No referring provider defined for this encounter. PCP: Patient, No Pcp Per  Subjective: Chief Complaint  Patient presents with  . Left Knee - Follow-up    HPI: Jackson MostCharles is a patient with left knee pain.  Has small radial meniscal tear on MRI scanning.  Has had symptoms for 3 months.  Is not too keen on injection.  I wanted him to really do his regular activities over the past several weeks to see how that knee reacted.  He states that he still is having pain and is really no better.  Describes pain when he sitting crosslegged as well as pain on the anterior and lateral aspect of that left knee.  He is been doing some jogging.  He is on his feet a lot at work.  He states his knee just does not really feel the same.  He has been taking some over-the-counter medications occasionally.              ROS: All systems reviewed are negative as they relate to the chief complaint within the history of present illness.  Patient denies  fevers or chills.   Assessment & Plan: Visit Diagnoses:  1. Injury of meniscus of left knee, subsequent encounter     Plan: Impression is left knee pain with meniscal pathology.  On my review I thought he may have more of a vertical tear in the posterior horn of the lateral meniscus.  Does have a small radial tear in that meniscus.  Plan at this time after lengthy discussion with he and his mother is for arthroscopic evaluation with meniscal debridement and/or repair.  Risk and benefits are discussed and the rehab times are also discussed.  Patient understands the risk and benefits.  All questions answered.  Follow-Up Instructions: No follow-ups on file.   Orders:  No orders of the defined types were placed in this encounter.  No orders of the defined types were placed in this encounter.     Procedures: No procedures  performed   Clinical Data: No additional findings.  Objective: Vital Signs: There were no vitals taken for this visit.  Physical Exam:   Constitutional: Patient appears well-developed HEENT:  Head: Normocephalic Eyes:EOM are normal Neck: Normal range of motion Cardiovascular: Normal rate Pulmonary/chest: Effort normal Neurologic: Patient is alert Skin: Skin is warm Psychiatric: Patient has normal mood and affect    Ortho Exam: Ortho exam demonstrates full active and passive range of motion of that left knee.  Trace effusion is present.  Has lateral greater than medial joint line tenderness.  Extensor mechanism is intact.  No masses lymphadenopathy or skin changes noted in that left knee region.  McMurray compression testing equivocal for lateral compartment pathology negative for medial compartment.  Specialty Comments:  No specialty comments available.  Imaging: No results found.   PMFS History: Patient Active Problem List   Diagnosis Date Noted  . Screen for STD (sexually transmitted disease) 10/14/2018  . Asthma in adult 08/23/2017  . Seasonal allergic rhinitis due to pollen 08/23/2017  . HIV (human immunodeficiency virus infection) (HCC) 08/20/2017  . Healthcare maintenance 08/20/2017  . Elevated AST (SGOT) 08/20/2017   Past Medical History:  Diagnosis Date  . Asthma   . HIV infection (HCC)  Family History  Problem Relation Age of Onset  . Healthy Mother     History reviewed. No pertinent surgical history. Social History   Occupational History  . Not on file  Tobacco Use  . Smoking status: Never Smoker  . Smokeless tobacco: Never Used  Substance and Sexual Activity  . Alcohol use: No    Frequency: Never  . Drug use: Yes    Frequency: 7.0 times per week    Types: Marijuana  . Sexual activity: Yes    Comment: given condoms

## 2019-01-20 ENCOUNTER — Encounter: Payer: Self-pay | Admitting: Orthopedic Surgery

## 2019-01-20 DIAGNOSIS — S83272D Complex tear of lateral meniscus, current injury, left knee, subsequent encounter: Secondary | ICD-10-CM | POA: Diagnosis not present

## 2019-01-20 DIAGNOSIS — S83272A Complex tear of lateral meniscus, current injury, left knee, initial encounter: Secondary | ICD-10-CM | POA: Diagnosis not present

## 2019-01-20 DIAGNOSIS — G8918 Other acute postprocedural pain: Secondary | ICD-10-CM | POA: Diagnosis not present

## 2019-01-21 ENCOUNTER — Telehealth: Payer: Self-pay | Admitting: Orthopedic Surgery

## 2019-01-21 NOTE — Telephone Encounter (Signed)
IC s/w patient and advised  

## 2019-01-21 NOTE — Telephone Encounter (Signed)
Please advise.  Do you recall what you gave to patient?

## 2019-01-21 NOTE — Telephone Encounter (Signed)
Patients mother called and stated that Dr.Dean gave patient exercise instructions to doat home and not sure how to do knee bending exercise.  Please call [phone]

## 2019-01-21 NOTE — Telephone Encounter (Signed)
On the discharge sheet it says straight leg raises 30 reps 3 times a day as well as knee range of motion exercises 30 reps 3 times a day

## 2019-01-27 ENCOUNTER — Ambulatory Visit (INDEPENDENT_AMBULATORY_CARE_PROVIDER_SITE_OTHER): Payer: BC Managed Care – PPO | Admitting: Orthopedic Surgery

## 2019-01-27 ENCOUNTER — Other Ambulatory Visit: Payer: Self-pay

## 2019-01-27 ENCOUNTER — Encounter: Payer: Self-pay | Admitting: Orthopedic Surgery

## 2019-01-27 ENCOUNTER — Telehealth: Payer: Self-pay

## 2019-01-27 DIAGNOSIS — S838X2D Sprain of other specified parts of left knee, subsequent encounter: Secondary | ICD-10-CM

## 2019-01-27 NOTE — Telephone Encounter (Signed)
Patient's mother Linwood Dibbles would like a copy of the OP Note for  Insurance purposes mailed to her address.  CB# [phone].  Please advise.  Thank you.

## 2019-01-27 NOTE — Telephone Encounter (Signed)
Will mail once dictation complete

## 2019-01-28 NOTE — Progress Notes (Signed)
   Post-Op Visit Note   Patient: Jackson [name]           Date of Birth: [birth year]           MRN: [id number] Visit Date: 01/27/2019 PCP: Patient, No Pcp Per   Assessment & Plan:  Chief Complaint:  Chief Complaint  Patient presents with  . Left Knee - Pain   Visit Diagnoses:  1. Injury of meniscus of left knee, subsequent encounter     Plan: Jackson [name] is a patient who is a week out left knee arthroscopy partial lateral meniscectomy.  He is doing well.  On exam mild effusion excellent range of motion.  I will have him start doing quad strengthening exercises and stationary bike.  Come back in 4 weeks for clinical recheck and release.  Follow-Up Instructions: No follow-ups on file.   Orders:  No orders of the defined types were placed in this encounter.  No orders of the defined types were placed in this encounter.   Imaging: No results found.  PMFS History: Patient Active Problem List   Diagnosis Date Noted  . Screen for STD (sexually transmitted disease) 10/14/2018  . Asthma in adult 08/23/2017  . Seasonal allergic rhinitis due to pollen 08/23/2017  . HIV (human immunodeficiency virus infection) ([place]) 08/20/2017  . Healthcare maintenance 08/20/2017  . Elevated AST (SGOT) 08/20/2017   Past Medical History:  Diagnosis Date  . Asthma   . HIV infection ([place])     Family History  Problem Relation Age of Onset  . Healthy Mother     History reviewed. No pertinent surgical history. Social History   Occupational History  . Not on file  Tobacco Use  . Smoking status: Never Smoker  . Smokeless tobacco: Never Used  Substance and Sexual Activity  . Alcohol use: No    Frequency: Never  . Drug use: Yes    Frequency: 7.0 times per week    Types: Marijuana  . Sexual activity: Yes    Comment: given condoms

## 2019-01-29 NOTE — Telephone Encounter (Signed)
mailed

## 2019-02-08 DIAGNOSIS — Z113 Encounter for screening for infections with a predominantly sexual mode of transmission: Secondary | ICD-10-CM | POA: Diagnosis not present

## 2019-02-24 ENCOUNTER — Other Ambulatory Visit: Payer: Self-pay

## 2019-02-24 ENCOUNTER — Ambulatory Visit (INDEPENDENT_AMBULATORY_CARE_PROVIDER_SITE_OTHER): Payer: BC Managed Care – PPO | Admitting: Orthopedic Surgery

## 2019-02-24 ENCOUNTER — Encounter: Payer: Self-pay | Admitting: Orthopedic Surgery

## 2019-02-24 DIAGNOSIS — S838X2D Sprain of other specified parts of left knee, subsequent encounter: Secondary | ICD-10-CM

## 2019-02-24 NOTE — Progress Notes (Signed)
   Post-Op Visit Note   Patient: Jackson [name]           Date of Birth: 2001/04/19           MRN: [id number] Visit Date: 02/24/2019 PCP: Patient, No Pcp Per   Assessment & Plan:  Chief Complaint:  Chief Complaint  Patient presents with  . Left Knee - Follow-up   Visit Diagnoses:  1. Injury of meniscus of left knee, subsequent encounter     Plan: Laverne is a patient with left knee pain.  He is 5 weeks out left knee arthroscopy and partial lateral meniscectomy.  Went back to work past week.  On examination he has trace effusion with good range of motion and only about 1 cm of quad atrophy.  Plan at this time is leg strengthening exercises.  No running.  Stationary bike encouraged.  Follow-up as needed  Follow-Up Instructions: Return if symptoms worsen or fail to improve.   Orders:  No orders of the defined types were placed in this encounter.  No orders of the defined types were placed in this encounter.   Imaging: No results found.  PMFS History: Patient Active Problem List   Diagnosis Date Noted  . Screen for STD (sexually transmitted disease) 10/14/2018  . Asthma in adult 08/23/2017  . Seasonal allergic rhinitis due to pollen 08/23/2017  . HIV (human immunodeficiency virus infection) ([place]) 08/20/2017  . Healthcare maintenance 08/20/2017  . Elevated AST (SGOT) 08/20/2017   Past Medical History:  Diagnosis Date  . Asthma   . HIV infection ([place])     Family History  Problem Relation Age of Onset  . Healthy Mother     History reviewed. No pertinent surgical history. Social History   Occupational History  . Not on file  Tobacco Use  . Smoking status: Never Smoker  . Smokeless tobacco: Never Used  Substance and Sexual Activity  . Alcohol use: No    Frequency: Never  . Drug use: Yes    Frequency: 7.0 times per week    Types: Marijuana  . Sexual activity: Yes    Comment: given condoms

## 2019-03-03 ENCOUNTER — Encounter: Payer: Self-pay | Admitting: Infectious Diseases

## 2019-03-03 ENCOUNTER — Other Ambulatory Visit: Payer: Self-pay

## 2019-03-03 ENCOUNTER — Ambulatory Visit (INDEPENDENT_AMBULATORY_CARE_PROVIDER_SITE_OTHER): Payer: BC Managed Care – PPO | Admitting: Infectious Diseases

## 2019-03-03 DIAGNOSIS — Z113 Encounter for screening for infections with a predominantly sexual mode of transmission: Secondary | ICD-10-CM | POA: Diagnosis not present

## 2019-03-03 DIAGNOSIS — Z21 Asymptomatic human immunodeficiency virus [HIV] infection status: Secondary | ICD-10-CM

## 2019-03-03 MED ORDER — BIKTARVY 50-200-25 MG PO TABS: 1.0000 | ORAL_TABLET | Freq: Every day | ORAL | 11 refills | Status: DC

## 2019-03-03 NOTE — Assessment & Plan Note (Signed)
Recently screened through [facility] and negative for gonorrhea and chlamydia with three-point testing.

## 2019-03-03 NOTE — Assessment & Plan Note (Signed)
Jackson [name] continues to do very well on his [place].  We reviewed in detail all of his previous labs today.  His viral loads have been undetectable for 15 months now.  We discussed U=U in detail and provided him reassurance that while I do not want him to be exposed to other STDs through unprotected sex, he should have good reassurance that his medications are working perfectly for him and his risk to transmit HIV to a partner sexually is essentially 0. We provided him with condoms and lubrication today. All vaccines are up-to-date.  He will return to clinic in 3 months for annual flu shot, office visit and to update his blood work.

## 2019-03-03 NOTE — Progress Notes (Signed)
Name: Jackson [name]  DOB: May 01, 2001  MRN: [id number] Patient, No Pcp Per   Patient Active Problem List   Diagnosis Date Noted  . Screen for STD (sexually transmitted disease) 10/14/2018  . Asthma in adult 08/23/2017  . Seasonal allergic rhinitis due to pollen 08/23/2017  . HIV (human immunodeficiency virus infection) ([place]) 08/20/2017  . Healthcare maintenance 08/20/2017  . Elevated AST (SGOT) 08/20/2017   Brief Narrative:  Jackson [name] is a 18 y.o. AA male with HIV infection. Originally diagnosed in 07/19/2017 after seeking STI testing. CD4 nadir 570. Also found to have chlamydia. Started on Biktarvy right away. History of OIs: none. HIV Risk: MSM.   Previous Regimens:   Biktarvy 2019  Genotype:   07/2017 - no mutations   SUBJECTIVE Chief Complaint  Patient presents with  . Follow-up   CC:  HIV follow up care.  In a new relationship and would like to discuss any precautions.   HPI:  Jackson [name] is doing well.  Interval history noted for arthroscopy of the left knee about 5 months ago with Dr. Marlou Sa.  He is recovered well but was disappointed because he was told not to run anymore.  He likes to run for mental health and physical health and weight loss.  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  He is continued to take his Biktarvy once daily without lapse.  He has no concerns with access or side effects to his medications.  He was recently screened for STDs at [facility] about a week ago and was negative on all 3 swabs.  He is in a new physical relationship with one male partner and has questions about his risk to transmit HIV.   Review of Systems  Constitutional: Negative for chills, fever, malaise/fatigue and weight loss.  HENT: Congestion: occasionally. Sore throat: occasionally         No dental problems.  Respiratory: Negative for cough, sputum production, shortness of breath and wheezing.   Cardiovascular:  Negative for chest pain and leg swelling.  Gastrointestinal: Negative for abdominal pain, diarrhea and vomiting.  Genitourinary: Negative for dysuria and flank pain.  Musculoskeletal: Negative for joint pain, myalgias and neck pain.  Skin: Negative for rash.  Neurological: Negative for dizziness, tingling and headaches.  Psychiatric/Behavioral: Negative for depression and substance abuse. The patient is not nervous/anxious and does not have insomnia.     Past Medical History:  Diagnosis Date  . Asthma   . HIV infection [facility])     Outpatient Medications Prior to Visit  Medication Sig Dispense Refill  . albuterol (PROVENTIL HFA;VENTOLIN HFA) 108 (90 Base) MCG/ACT inhaler Inhale 2 puffs into the lungs every 4 (four) hours as needed for wheezing or shortness of breath. 1 Inhaler 3  . cetirizine (ZYRTEC) 10 MG tablet Take 10 mg by mouth daily.    . fluticasone (FLOVENT HFA) 44 MCG/ACT inhaler Inhale 2 puffs into the lungs 2 (two) times daily. For 1-2 weeks or until breathing returns to baseline. 1 Inhaler 3  . ipratropium (ATROVENT) 0.03 % nasal spray Place 2 sprays into both nostrils 2 (two) times daily. As needed 30 mL 12  . bictegravir-emtricitabine-tenofovir AF (BIKTARVY) 50-200-25 MG TABS tablet Take 1 tablet by mouth daily. 30 tablet 3  . azithromycin (ZITHROMAX) 250 MG tablet Take 4 tablets by mouth once 4 each 0  . meloxicam (MOBIC) 15 MG tablet Take 0.5-1 tablets (7.5-15 mg total) by mouth daily as  needed for pain. 30 tablet 6   No facility-administered medications prior to visit.    Allergies  Allergen Reactions  . Amoxicillin Hives    Social History   Tobacco Use  . Smoking status: Never Smoker  . Smokeless tobacco: Never Used  Substance Use Topics  . Alcohol use: No    Frequency: Never  . Drug use: Yes    Frequency: 7.0 times per week    Types: Marijuana    Family History  Problem Relation Age of Onset  . Healthy Mother     Social History   Substance and  Sexual Activity  Sexual Activity Yes  . Partners: Female, Male  . Birth control/protection: Condom   Comment: given condoms    OBJECTIVE: Vitals:   03/03/19 1516  BP: 133/74  Pulse: 74  Temp: 98.4 F (36.9 C)  TempSrc: Oral  SpO2: 99%  Weight: 188 lb (85.3 kg)  Height: 5\' 9"  (1.753 m)   Body mass index is 27.76 kg/m.  Physical Exam  Constitutional: He is oriented to person, place, and time and well-developed, well-nourished, and in no distress.  Pleasant young male seated comfortably in chair. Appears well today.   HENT:  Head: Normocephalic.  Mouth/Throat: Oropharynx is clear and moist. No oral lesions. Normal dentition. No dental caries.  Eyes: Pupils are equal, round, and reactive to light. Right eye exhibits no discharge. Left eye exhibits no discharge. No scleral icterus.  Cardiovascular: Normal rate, regular rhythm and normal heart sounds.  Pulmonary/Chest: Effort normal and breath sounds normal. No respiratory distress. He has no wheezes. He has no rales.  Abdominal: Soft. He exhibits no distension. There is no abdominal tenderness.  Musculoskeletal: Normal range of motion.  Lymphadenopathy:    He has no cervical adenopathy.  Neurological: He is alert and oriented to person, place, and time.  Skin: Skin is warm and dry. No rash noted.  Psychiatric: Mood and affect normal.  Vitals reviewed.   Lab Results Lab Results  Component Value Date   WBC 10.0 10/14/2018   HGB 15.3 10/14/2018   HCT 43.6 10/14/2018   MCV 87.2 10/14/2018   PLT 265 10/14/2018    Lab Results  Component Value Date   CREATININE 1.12 10/14/2018   BUN 11 10/14/2018   NA 138 10/14/2018   K 3.8 10/14/2018   CL 102 10/14/2018   CO2 28 10/14/2018    Lab Results  Component Value Date   ALT 32 10/14/2018   AST 38 (H) 10/14/2018   BILITOT 1.3 (H) 10/14/2018    Lab Results  Component Value Date   CHOL 154 10/14/2018   HDL 57 10/14/2018   LDLCALC 80 10/14/2018   TRIG 89 10/14/2018    CHOLHDL 2.7 10/14/2018   HIV 1 RNA Quant (copies/mL)  Date Value  10/14/2018 <20 DETECTED (A)  05/28/2018 <20 NOT DETECTED  11/20/2017 <20 NOT DETECTED   CD4 T Cell Abs (/uL)  Date Value  10/14/2018 590  05/28/2018 700  09/17/2017 590    ASSESSMENT & PLAN: Problem List Items Addressed This Visit      Unprioritized   HIV (human immunodeficiency virus infection) (HCC) (Chronic)    Jackson [name] continues to do very well on his [place]ik[place].  We reviewed in detail all of his previous labs today.  His viral loads have been undetectable for 15 months now.  We discussed U=U in detail and provided him reassurance that while I do not want him to be exposed to other STDs through unprotected  sex, he should have good reassurance that his medications are working perfectly for him and his risk to transmit HIV to a partner sexually is essentially 0. We provided him with condoms and lubrication today. All vaccines are up-to-date.  He will return to clinic in 3 months for annual flu shot, office visit and to update his blood work.      Relevant Medications   bictegravir-emtricitabine-tenofovir AF (BIKTARVY) 50-200-25 MG TABS tablet   Screen for STD (sexually transmitted disease)    [facility] screened through [facility] and negative for gonorrhea and chlamydia with three-point testing.         Return in about 3 months (around 06/03/2019).   Rexene AlbertsStephanie Kattaleya Alia, MSN, NP-C [facility] Pager: [phone]  03/03/2019

## 2019-03-03 NOTE — Patient Instructions (Addendum)
Always nice to see you Jackson [name].  Please continue taking your Biktarvy once a day as you are.  We will repeat your blood work in 3 months at your return visit.  For your knee - look into KT tape and a patella strap.  The KT tape you can purchase online or through [email or web address]  The patella straps can be found in your local pharmacy.  Search on YouTube for different ways to tape your knee to help with support and stability if you continue to desire to exercise.  We will see you back in the office in 3 months.  We will give you your flu shot at this visit and draw blood.

## 2019-06-02 ENCOUNTER — Other Ambulatory Visit: Payer: Self-pay

## 2019-06-02 ENCOUNTER — Encounter: Payer: Self-pay | Admitting: Infectious Diseases

## 2019-06-02 ENCOUNTER — Ambulatory Visit (INDEPENDENT_AMBULATORY_CARE_PROVIDER_SITE_OTHER): Payer: BC Managed Care – PPO | Admitting: Infectious Diseases

## 2019-06-02 ENCOUNTER — Other Ambulatory Visit (HOSPITAL_COMMUNITY)
Admission: RE | Admit: 2019-06-02 | Discharge: 2019-06-02 | Disposition: A | Discharge status: Home / Self Care | Payer: BC Managed Care – PPO | Source: Ambulatory Visit | Attending: Infectious Diseases | Admitting: Infectious Diseases

## 2019-06-02 VITALS — BP 143/78 | HR 87 | Wt 192.0 lb

## 2019-06-02 DIAGNOSIS — Z113 Encounter for screening for infections with a predominantly sexual mode of transmission: Secondary | ICD-10-CM | POA: Diagnosis not present

## 2019-06-02 DIAGNOSIS — R4589 Other symptoms and signs involving emotional state: Secondary | ICD-10-CM | POA: Insufficient documentation

## 2019-06-02 DIAGNOSIS — Z0000 Encounter for general adult medical examination without abnormal findings: Secondary | ICD-10-CM

## 2019-06-02 DIAGNOSIS — Z21 Asymptomatic human immunodeficiency virus [HIV] infection status: Secondary | ICD-10-CM

## 2019-06-02 DIAGNOSIS — J4520 Mild intermittent asthma, uncomplicated: Secondary | ICD-10-CM

## 2019-06-02 DIAGNOSIS — Z23 Encounter for immunization: Secondary | ICD-10-CM | POA: Diagnosis not present

## 2019-06-02 NOTE — Assessment & Plan Note (Signed)
Uncertain if this is relapsing or new isolated event; no indication for medications at this time but he would like to make an appointment with Marcie Bal for counseling services. He will schedule a visit with her here at [facility] and follow up as she recommends with [facility]. Emotional support provided today.

## 2019-06-02 NOTE — Patient Instructions (Addendum)
Nice to see you!   Please continue your Biktarvy every day as scheduled.   Please schedule an appointment with Marcie Bal to consider formal talk therapy. I think you will really get along with her to help work through some of the things you are going through now.   We gave you your flu shot today.   Please schedule a follow up appointment with Colletta Maryland again in 4 months.

## 2019-06-02 NOTE — Progress Notes (Signed)
Name: Jackson [name]  DOB: 04/13/2001  MRN: [id number] Patient, No Pcp Per   Patient Active Problem List   Diagnosis Date Noted  . HIV (human immunodeficiency virus infection) (HCC) 08/20/2017    Priority: High  . Depressed mood 06/02/2019  . Screen for STD (sexually transmitted disease) 10/14/2018  . Asthma in adult 08/23/2017  . Seasonal allergic rhinitis due to pollen 08/23/2017  . Healthcare maintenance 08/20/2017  . Elevated AST (SGOT) 08/20/2017   Brief Narrative:  Jackson MostCharles is a [age] y.o. AA male with HIV infection. Originally diagnosed in 07/19/2017 after seeking STI testing. CD4 nadir 570. History of OIs: none. HIV Risk: MSM.   Previous Regimens:   Biktarvy 2019  Genotype:   07/2017 - no mutations   SUBJECTIVE Chief Complaint  Patient presents with  . Follow-up   CC:  HIV follow up care. Worsening depressed mood. STI concern.    HPI:  Jackson MostCharles is doing well aside from "going through some things." He feels that he has had some sad/depressed moments that have increased since last office visit. He has a close relationship with his mother and finds her supportive. He has given consideration to speak with someone about his feelings/experiences and thinks it will be helpful for him. He is with the same male partner and has had no other sexual encounters/partners. Would like STI screening to be on the safe side; he is not having symptoms today. He is not sleeping well - tries to put himself to bed around 11:30 p.m. but some days not until 2:00 am. He feels that the reason he is in bed late is because he helps out his friends a lot and cannot say no to people easily which infringes on his self-care. He has early morning classes and work which makes it hard to get adequate sleep. He has a normal appetite and no unexpected weight change.   He has continued his Biktarvy once a day as prescribed. He has no concern for access to his medications or side effects to medication. Reports no  complaints today suggestive of associated opportunistic infection or advancing HIV disease such as fevers, night sweats, weight loss, anorexia, cough, SOB, nausea, vomiting, diarrhea, headache, sensory changes, lymphadenopathy or oral thrush.     Review of Systems  Constitutional: Negative for chills and fever.  HENT: Negative for tinnitus.   Eyes: Negative for blurred vision and photophobia.  Respiratory: Negative for cough and sputum production.   Cardiovascular: Negative for chest pain.  Gastrointestinal: Negative for diarrhea, nausea and vomiting.  Genitourinary: Negative for dysuria.  Skin: Negative for rash.  Neurological: Negative for headaches.  Psychiatric/Behavioral: Positive for depression. Negative for substance abuse. The patient is nervous/anxious and has insomnia.      Past Medical History:  Diagnosis Date  . Asthma   . HIV infection [facility](HCC)     Outpatient Medications Prior to Visit  Medication Sig Dispense Refill  . albuterol (PROVENTIL HFA;VENTOLIN HFA) 108 (90 Base) MCG/ACT inhaler Inhale 2 puffs into the lungs every 4 (four) hours as needed for wheezing or shortness of breath. 1 Inhaler 3  . bictegravir-emtricitabine-tenofovir AF (BIKTARVY) 50-200-25 MG TABS tablet Take 1 tablet by mouth daily. 30 tablet 11  . cetirizine (ZYRTEC) 10 MG tablet Take 10 mg by mouth daily.    . fluticasone (FLOVENT HFA) 44 MCG/ACT inhaler Inhale 2 puffs into the lungs 2 (two) times daily. For 1-2 weeks or until breathing returns to baseline. 1 Inhaler 3  . ipratropium (ATROVENT) 0.03 %  nasal spray Place 2 sprays into both nostrils 2 (two) times daily. As needed 30 mL 12   No facility-administered medications prior to visit.    Allergies  Allergen Reactions  . Amoxicillin Hives    Social History   Tobacco Use  . Smoking status: Never Smoker  . Smokeless tobacco: Never Used  Substance Use Topics  . Alcohol use: No    Frequency: Never  . Drug use: Yes    Frequency: 7.0 times  per week    Types: Marijuana    Family History  Problem Relation Age of Onset  . Healthy Mother     Social History   Substance and Sexual Activity  Sexual Activity Yes  . Partners: Female, Male  . Birth control/protection: Condom   Comment: given condoms    OBJECTIVE: Vitals:   06/02/19 1527  BP: (!) 143/78  Pulse: 87  Weight: 192 lb (87.1 kg)   Body mass index is 28.35 kg/m.  Physical Exam  Constitutional: He is oriented to person, place, and time and well-developed, well-nourished, and in no distress.  HENT:  Mouth/Throat: No oral lesions. Normal dentition. No dental caries.  Eyes: No scleral icterus.  Cardiovascular: Normal rate, regular rhythm and normal heart sounds.  Pulmonary/Chest: Effort normal and breath sounds normal.  Abdominal: Soft. He exhibits no distension. There is no abdominal tenderness.  Lymphadenopathy:    He has no cervical adenopathy.  Neurological: He is alert and oriented to person, place, and time.  Skin: Skin is warm and dry. No rash noted.  Psychiatric: Mood and affect normal.  Slightly withdrawn today    Lab Results Lab Results  Component Value Date   WBC 10.0 10/14/2018   HGB 15.3 10/14/2018   HCT 43.6 10/14/2018   MCV 87.2 10/14/2018   PLT 265 10/14/2018    Lab Results  Component Value Date   CREATININE 1.12 10/14/2018   BUN 11 10/14/2018   NA 138 10/14/2018   K 3.8 10/14/2018   CL 102 10/14/2018   CO2 28 10/14/2018    Lab Results  Component Value Date   ALT 32 10/14/2018   AST 38 (H) 10/14/2018   BILITOT 1.3 (H) 10/14/2018    Lab Results  Component Value Date   CHOL 154 10/14/2018   HDL 57 10/14/2018   LDLCALC 80 10/14/2018   TRIG 89 10/14/2018   CHOLHDL 2.7 10/14/2018   HIV 1 RNA Quant (copies/mL)  Date Value  10/14/2018 <20 DETECTED (A)  05/28/2018 <20 NOT DETECTED  11/20/2017 <20 NOT DETECTED   CD4 T Cell Abs (/uL)  Date Value  10/14/2018 590  05/28/2018 700  09/17/2017 590    ASSESSMENT &  PLAN: Problem List Items Addressed This Visit      High   HIV (human immunodeficiency virus infection) ([place]) - Primary (Chronic)    Under excellent control with last VL < 20 and CD4 590 in March. Will update today to ensure still the case. No new over the counter or prescribed medications. He does not take Tums or MVIs. He will continue his Biktarvy every day as prescribed.  Flu vaccine today - otherwise up to do date for vaccines recommended for [facility].  STI screening today. Condoms recommended/provided.  Return in about 4 months (around 10/03/2019). with labs at the visit.       Relevant Orders   HIV-1 RNA quant-no reflex-bld     Unprioritized   Healthcare maintenance    Flu vaccine today.  No preventative screening recommended  for age.       Asthma in adult    Under good control on prn Albuterol.       Depressed mood    Uncertain if this is relapsing or new isolated event; no indication for medications at this time but he would like to make an appointment with Marylu Lund for counseling services. He will schedule a visit with her here at [facility] and follow up as she recommends with [facility]. Emotional support provided today.        Other Visit Diagnoses    Routine screening for STI (sexually transmitted infection)       Relevant Orders   Urine cytology ancillary only   Cytology (oral, anal, urethral) ancillary only   Cytology (oral, anal, urethral) ancillary only   RPR   Need for immunization against influenza       Relevant Orders   Flu Vaccine QUAD 36+ mos IM (Completed)      Rexene Alberts, MSN, NP-C [facility] for Infectious Disease [facility] Pager: [phone]  06/02/2019

## 2019-06-02 NOTE — Assessment & Plan Note (Signed)
Under good control on prn Albuterol.

## 2019-06-02 NOTE — Assessment & Plan Note (Signed)
Flu vaccine today.  No preventative screening recommended for age.

## 2019-06-02 NOTE — Assessment & Plan Note (Addendum)
Under excellent control with last VL < 20 and CD4 590 in March. Will update today to ensure still the case. No new over the counter or prescribed medications. He does not take Tums or MVIs. He will continue his Biktarvy every day as prescribed.  Flu vaccine today - otherwise up to do date for vaccines recommended for [facility].  STI screening today. Condoms recommended/provided.  Return in about 4 months (around 10/03/2019). with labs at the visit.

## 2019-06-05 LAB — CYTOLOGY, (ORAL, ANAL, URETHRAL) ANCILLARY ONLY
Chlamydia: NEGATIVE
Chlamydia: NEGATIVE
Comment: NEGATIVE
Comment: NEGATIVE
Comment: NORMAL
Comment: NORMAL
Neisseria Gonorrhea: NEGATIVE
Neisseria Gonorrhea: NEGATIVE

## 2019-06-05 LAB — URINE CYTOLOGY ANCILLARY ONLY
Chlamydia: NEGATIVE
Comment: NEGATIVE
Comment: NORMAL
Neisseria Gonorrhea: NEGATIVE

## 2019-06-05 LAB — HIV-1 RNA QUANT-NO REFLEX-BLD
HIV 1 RNA Quant: <20 copies/mL
HIV-1 RNA Quant, Log: <1.3 Log copies/mL

## 2019-06-26 ENCOUNTER — Ambulatory Visit: Payer: BC Managed Care – PPO

## 2019-08-28 ENCOUNTER — Telehealth: Payer: Self-pay | Admitting: Pharmacy Technician

## 2019-08-28 NOTE — Telephone Encounter (Signed)
No additional prescription needed.

## 2019-08-28 NOTE — Telephone Encounter (Signed)
[facility] Patient Advocate Encounter  Completed and sent [employer] Advancing Access application for Biktarvy for this patient who is uninsured.    Patient is approved 08/28/2019 through 09/28/2019.  BIN      [id number] PCN    [id number] GRP    [id number] ID        [id number]  He has the information to provide his pharmacy.  He will let the pharmacy know once his insurance becomes active.  I have submitted [employer] for additional months of coverage in case the insurance does not become active.   Netty Starring. Dimas Aguas CPhT Specialty Pharmacy Patient [facility] Disease Phone: [phone] Fax:  [phone]

## 2019-09-04 NOTE — Telephone Encounter (Signed)
[facility] Patient Advocate Encounter   Patient has been approved for [employer] Advancing Access Patient Assistance Program for [place]  from 08/28/2019 to 08/27/2020. This assistance will make the patient's copay $0.  The billing information is.  Member ID: [id number] RxBin: [id number] PCN: [id number] Group: [id number]  Patient knows to call the office with questions or concerns.  Beulah Gandy, CPhT Specialty Pharmacy Patient [facility] for Infectious Disease Phone: [phone] Fax: [phone] 09/04/2019 12:10 PM

## 2019-10-02 ENCOUNTER — Ambulatory Visit: Payer: BC Managed Care – PPO | Admitting: Infectious Diseases

## 2019-10-21 ENCOUNTER — Telehealth: Payer: Self-pay

## 2019-10-21 NOTE — Telephone Encounter (Signed)
COVID-19 Pre-Screening Questions:10/21/19  Do you currently have a fever (>100 F), chills or unexplained body aches?NO  Are you currently experiencing new cough, shortness of breath, sore throat, runny nose? NO .  Have you recently travelled outside the state of [place] in the last 14 days?NO .  Have you been in contact with someone that is currently pending confirmation of Covid19 testing or has been confirmed to have the Covid19 virus?  NO  **If the patient answers NO to ALL questions -  advise the patient to please call the clinic before coming to the office should any symptoms develop.     

## 2019-10-22 ENCOUNTER — Ambulatory Visit: Payer: Self-pay | Admitting: Infectious Diseases

## 2019-10-29 ENCOUNTER — Encounter: Payer: Self-pay | Admitting: Infectious Diseases

## 2019-10-29 ENCOUNTER — Ambulatory Visit (INDEPENDENT_AMBULATORY_CARE_PROVIDER_SITE_OTHER): Payer: BC Managed Care – PPO | Admitting: Infectious Diseases

## 2019-10-29 ENCOUNTER — Other Ambulatory Visit: Payer: Self-pay

## 2019-10-29 VITALS — BP 125/84 | HR 69 | Temp 97.8°F | Ht 69.0 in | Wt 200.0 lb

## 2019-10-29 DIAGNOSIS — R4589 Other symptoms and signs involving emotional state: Secondary | ICD-10-CM

## 2019-10-29 DIAGNOSIS — Z21 Asymptomatic human immunodeficiency virus [HIV] infection status: Secondary | ICD-10-CM | POA: Diagnosis not present

## 2019-10-29 DIAGNOSIS — Z113 Encounter for screening for infections with a predominantly sexual mode of transmission: Secondary | ICD-10-CM | POA: Diagnosis not present

## 2019-10-29 DIAGNOSIS — Z0000 Encounter for general adult medical examination without abnormal findings: Secondary | ICD-10-CM

## 2019-10-29 NOTE — Assessment & Plan Note (Signed)
Discussed COVID vaccine, patient is hesitant due to fear of side effects. Those concerns were addressed and more information given in AVS.

## 2019-10-29 NOTE — Progress Notes (Signed)
Subjective:    Patient ID: Jackson [name] is a 19 y.o. male     DOB: 08-14-2001   MRN: [id number]   Patient Active Problem List   Diagnosis Date Noted  . Depressed mood 06/02/2019  . Screen for STD (sexually transmitted disease) 10/14/2018  . Asthma in adult 08/23/2017  . Seasonal allergic rhinitis due to pollen 08/23/2017  . HIV (human immunodeficiency virus infection) (HCC) 08/20/2017  . Healthcare maintenance 08/20/2017  . Elevated AST (SGOT) 08/20/2017     Chief Complaint  Patient presents with  . Follow-up      HPI  Interested in [place] injection for HIV treatment as well as the SOLAR study.   Doing well with Biktarvy taking it mostly everyday and uses a pill box.   Talked about COVID vaccine but he is apprehensive.   He is feeling more anxious after a confrontational incident he had with another individual. He is interested in talk therapy. Will give information about Family Services in the AVS.    Review of Systems  Constitutional: Negative for chills, fever, malaise/fatigue and weight loss.  Eyes: Negative for double vision.  Respiratory: Negative for cough, shortness of breath and wheezing.   Cardiovascular: Negative for chest pain, palpitations and leg swelling.  Gastrointestinal: Negative for abdominal pain, constipation, diarrhea, nausea and vomiting.  Musculoskeletal: Negative for back pain and neck pain.  Skin: Negative for rash.  Neurological: Negative for dizziness and headaches.  Psychiatric/Behavioral: Negative for depression. The patient is nervous/anxious. The patient does not have insomnia.     Outpatient Medications Prior to Visit  Medication Sig Dispense Refill  . bictegravir-emtricitabine-tenofovir AF (BIKTARVY) 50-200-25 MG TABS tablet Take 1 tablet by mouth daily. 30 tablet 11  . albuterol (PROVENTIL HFA;VENTOLIN HFA) 108 (90 Base) MCG/ACT inhaler Inhale 2 puffs into the lungs every 4 (four) hours as needed for wheezing or shortness of  breath. (Patient not taking: Reported on 10/29/2019) 1 Inhaler 3  . cetirizine (ZYRTEC) 10 MG tablet Take 10 mg by mouth daily.    . fluticasone (FLOVENT HFA) 44 MCG/ACT inhaler Inhale 2 puffs into the lungs 2 (two) times daily. For 1-2 weeks or until breathing returns to baseline. (Patient not taking: Reported on 10/29/2019) 1 Inhaler 3  . ipratropium (ATROVENT) 0.03 % nasal spray Place 2 sprays into both nostrils 2 (two) times daily. As needed (Patient not taking: Reported on 10/29/2019) 30 mL 12   No facility-administered medications prior to visit.    Past Medical History:  Diagnosis Date  . Asthma   . HIV infection (HCC)     Family History  Problem Relation Age of Onset  . Healthy Mother     Social History   Socioeconomic History  . Marital status: Single    Spouse name: Not on file  . Number of children: Not on file  . Years of education: Not on file  . Highest education level: Not on file  Occupational History  . Not on file  Tobacco Use  . Smoking status: Never Smoker  . Smokeless tobacco: Never Used  Substance and Sexual Activity  . Alcohol use: No  . Drug use: Yes    Frequency: 7.0 times per week    Types: Marijuana  . Sexual activity: Yes    Partners: Female, Male    Birth control/protection: Condom    Comment: given condoms  Other Topics Concern  . Not on file  Social History Narrative  . Not on file   Social Determinants  of Health   Financial Resource Strain:   . Difficulty of Paying Living Expenses:   Food Insecurity:   . Worried About Charity fundraiser in the Last Year:   . Arboriculturist in the Last Year:   Transportation Needs:   . Film/video editor (Medical):   Marland Kitchen Lack of Transportation (Non-Medical):   Physical Activity:   . Days of Exercise per Week:   . Minutes of Exercise per Session:   Stress:   . Feeling of Stress :   Social Connections:   . Frequency of Communication with Friends and Family:   . Frequency of Social Gatherings  with Friends and Family:   . Attends Religious Services:   . Active Member of Clubs or Organizations:   . Attends Archivist Meetings:   Marland Kitchen Marital Status:   Intimate Partner Violence:   . Fear of Current or Ex-Partner:   . Emotionally Abused:   Marland Kitchen Physically Abused:   . Sexually Abused:          Objective:    Today's Vitals   10/29/19 1421  BP: 125/84  Pulse: 69  Temp: 97.8 F (36.6 C)  SpO2: 97%  Weight: 200 lb (90.7 kg)  Height: 5\' 9"  (1.753 m)   Body mass index is 29.53 kg/m.  Physical Exam Constitutional:      Appearance: Normal appearance. He is normal weight.  HENT:     Head: Normocephalic.     Nose: Nose normal.  Eyes:     Extraocular Movements: Extraocular movements intact.     Pupils: Pupils are equal, round, and reactive to light.  Cardiovascular:     Rate and Rhythm: Normal rate and regular rhythm.     Heart sounds: Normal heart sounds.  Pulmonary:     Effort: Pulmonary effort is normal.     Breath sounds: Normal breath sounds.  Musculoskeletal:        General: Normal range of motion.     Cervical back: Normal range of motion.  Skin:    General: Skin is warm and dry.  Neurological:     Mental Status: He is alert and oriented to person, place, and time.  Psychiatric:        Mood and Affect: Mood normal.        Behavior: Behavior normal.        Thought Content: Thought content normal.        Judgment: Judgment normal.      LABS: Lab Results  Component Value Date   HIV1RNAQUANT <20 NOT DETECTED 06/02/2019   HIV1RNAQUANT <20 DETECTED (A) 10/14/2018   HIV1RNAQUANT <20 NOT DETECTED 05/28/2018    Lab Results  Component Value Date   CREATININE 1.12 10/14/2018   CREATININE 1.04 05/28/2018   CREATININE 1.04 09/17/2017    Lab Results  Component Value Date   ALT 32 10/14/2018   AST 38 (H) 10/14/2018   BILITOT 1.3 (H) 10/14/2018    Lab Results  Component Value Date   WBC 10.0 10/14/2018   HGB 15.3 10/14/2018   HCT 43.6  10/14/2018   MCV 87.2 10/14/2018   PLT 265 10/14/2018    No results found for: RPR      Assessment & Plan:   Problem List Items Addressed This Visit      Other   HIV (human immunodeficiency virus infection) ([place]) - Primary (Chronic)    Patient is doing well on [place]. Continue taking Biktarvy 1 tablet daily. Today will  check labs for HIV viral load, CD4 count, CBC with differential/platelet, CMP with GFR and RPR.   Patient is interested in [place] study. Discussed both and information given in AVS. Patient will reach out to study panel if he wants to pursue.       Relevant Orders   HIV-1 RNA quant-no reflex-bld   CBC with Differential/Platelet   T-helper cell (CD4)- ([facility] clinic only)   COMPLETE METABOLIC PANEL WITH GFR   Healthcare maintenance    Discussed COVID vaccine, patient is hesitant due to fear of side effects. Those concerns were addressed and more information given in AVS.       Depressed mood    Patient reported recently being triggered by a verbal confrontation he had with a stranger at a gas station. 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 He made him want to revisit the idea of counseling. Educated patient about talk therapy at [facility] and information given in the AVS.        Other Visit Diagnoses    Routine screening for STI (sexually transmitted infection)       Relevant Orders   RPR      Luvenia Starch FNP Student [employer]     ATTESTATION:  The patient was seen and examined with the NP student - I agree with her exam, assessment and plan.   Jackson [name] is a 19 y.o. male has had well controlled HIV on Biktarvy. Discussed SOLAR study and he will consider this and reach out to Lisa/Kim about it after he thinks about it more. He can return to clinic in 4 months. Accepted condoms today, declined STI testing.   Asthma / allergies well controlled off  maintenance medications.   Has had some recent events that have brought forward some old emotions and traumatic memories - discussed options and will proceed with counseling sessions through [facility].  No indications for medication at this time.     Rexene Alberts, MSN, NP-C [facility] for Infectious Disease [facility]  [email or web address] Pager: [phone] Office: [phone] [facility] Main Line: [phone]

## 2019-10-29 NOTE — Assessment & Plan Note (Addendum)
Patient is doing well on Biktarvy. Continue taking Biktarvy 1 tablet daily. Today will check labs for HIV viral load, CD4 count, CBC with differential/platelet, CMP with GFR and RPR.   Patient is interested in [place] study. Discussed both and information given in AVS. Patient will reach out to study panel if he wants to pursue.

## 2019-10-29 NOTE — Patient Instructions (Addendum)
Nice to see you today!  Please stop by the lab today on your way out.   [facility]:  Walk In clinic 8:30 - 2 pm M-Friday - I think this will be helpful for you for processing of things.   Would like to see you back in 4 months for routine care.     For your COVID Vaccine -  There are 3 options:   1. Pfizer - 2 doses 3 weeks apart, 95% protective  2. Moderna - 2 doses 4 weeks apart, 95% protective  3. Johnsen & Johnsen - 1 dose, 70% protective   To Schedule at [facility]:  To book an appointment, go to [email or web address]   OR  For the [employer] [facility] at [facility]:  Call [phone] to schedule your appointment

## 2019-10-29 NOTE — Assessment & Plan Note (Signed)
Patient reported recently being triggered by a verbal confrontation he had with a stranger at a gas station. 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 He made him want to revisit the idea of counseling. Educated patient about talk therapy at [facility] and information given in the AVS.

## 2019-10-30 LAB — T-HELPER CELL (CD4) - (RCID CLINIC ONLY)
CD4 % Helper T Cell: 33 % (ref 33–65)
CD4 T Cell Abs: 894 /uL (ref 400–1790)

## 2019-10-31 LAB — COMPLETE METABOLIC PANEL WITH GFR
AG Ratio: 1.3 (calc) (ref 1.0–2.5)
ALT: 34 U/L (ref 9–46)
AST: 29 U/L (ref 10–40)
Albumin: 4.4 g/dL (ref 3.6–5.1)
Alkaline phosphatase (APISO): 81 U/L (ref 36–130)
BUN: 10 mg/dL (ref 7–25)
CO2: 27 mmol/L (ref 20–32)
Calcium: 9.8 mg/dL (ref 8.6–10.3)
Chloride: 105 mmol/L (ref 98–110)
Creat: 1.08 mg/dL (ref 0.60–1.35)
GFR, Est African American: 114 mL/min/{1.73_m2} (ref >=60)
GFR, Est Non African American: 98 mL/min/{1.73_m2} (ref >=60)
Globulin: 3.4 g/dL (calc) (ref 1.9–3.7)
Glucose, Bld: 85 mg/dL (ref 65–99)
Potassium: 4 mmol/L (ref 3.5–5.3)
Sodium: 139 mmol/L (ref 135–146)
Total Bilirubin: 0.6 mg/dL (ref 0.2–1.2)
Total Protein: 7.8 g/dL (ref 6.1–8.1)

## 2019-10-31 LAB — CBC WITH DIFFERENTIAL/PLATELET
Absolute Monocytes: 770 cells/uL (ref 200–950)
Basophils Absolute: 39 cells/uL (ref 0–200)
Basophils Relative: 0.5 %
Eosinophils Absolute: 31 cells/uL (ref 15–500)
Eosinophils Relative: 0.4 %
HCT: 44.8 % (ref 38.5–50.0)
Hemoglobin: 15.5 g/dL (ref 13.2–17.1)
Lymphs Abs: 2633 cells/uL (ref 850–3900)
MCH: 30.3 pg (ref 27.0–33.0)
MCHC: 34.6 g/dL (ref 32.0–36.0)
MCV: 87.5 fL (ref 80.0–100.0)
MPV: 10.2 fL (ref 7.5–12.5)
Monocytes Relative: 10 %
Neutro Abs: 4227 cells/uL (ref 1500–7800)
Neutrophils Relative %: 54.9 %
Platelets: 283 10*3/uL (ref 140–400)
RBC: 5.12 10*6/uL (ref 4.20–5.80)
RDW: 12.8 % (ref 11.0–15.0)
Total Lymphocyte: 34.2 %
WBC: 7.7 10*3/uL (ref 3.8–10.8)

## 2019-10-31 LAB — RPR: RPR Ser Ql: NONREACTIVE

## 2019-10-31 LAB — HIV-1 RNA QUANT-NO REFLEX-BLD
HIV 1 RNA Quant: <20 copies/mL
HIV-1 RNA Quant, Log: <1.3 Log copies/mL

## 2019-11-22 DIAGNOSIS — Z20828 Contact with and (suspected) exposure to other viral communicable diseases: Secondary | ICD-10-CM | POA: Diagnosis not present

## 2019-11-22 DIAGNOSIS — U071 COVID-19: Secondary | ICD-10-CM | POA: Diagnosis not present

## 2019-11-22 DIAGNOSIS — Z03818 Encounter for observation for suspected exposure to other biological agents ruled out: Secondary | ICD-10-CM | POA: Diagnosis not present

## 2019-11-24 ENCOUNTER — Telehealth: Payer: Self-pay | Admitting: Orthopedic Surgery

## 2019-11-24 NOTE — Telephone Encounter (Signed)
Received vm from [facility] w/ [facility]. Needs records as they need to file appeal for DOS 10-May-2001. I faxed records [phone]. Ph [phone] ext [phone]

## 2020-03-08 ENCOUNTER — Ambulatory Visit: Payer: BC Managed Care – PPO | Admitting: Infectious Diseases

## 2020-03-09 ENCOUNTER — Other Ambulatory Visit: Payer: Self-pay

## 2020-03-09 ENCOUNTER — Telehealth: Payer: Self-pay | Admitting: *Deleted

## 2020-03-09 ENCOUNTER — Encounter: Payer: Self-pay | Admitting: Infectious Diseases

## 2020-03-09 ENCOUNTER — Ambulatory Visit (INDEPENDENT_AMBULATORY_CARE_PROVIDER_SITE_OTHER): Payer: BC Managed Care – PPO | Admitting: Infectious Diseases

## 2020-03-09 VITALS — BP 119/75 | HR 71 | Wt 200.0 lb

## 2020-03-09 DIAGNOSIS — R4589 Other symptoms and signs involving emotional state: Secondary | ICD-10-CM | POA: Diagnosis not present

## 2020-03-09 DIAGNOSIS — Z21 Asymptomatic human immunodeficiency virus [HIV] infection status: Secondary | ICD-10-CM | POA: Diagnosis not present

## 2020-03-09 MED ORDER — BIKTARVY 50-200-25 MG PO TABS: 1.0000 | ORAL_TABLET | Freq: Every day | ORAL | 11 refills | Status: DC

## 2020-03-09 NOTE — Progress Notes (Signed)
Name: Jackson [name]  DOB: Jul 13, 2001  MRN: [id number] Patient, No Pcp Per    Patient Active Problem List   Diagnosis Date Noted  . HIV (human immunodeficiency virus infection) (HCC) 08/20/2017    Priority: High  . Depressed mood 06/02/2019  . Screen for STD (sexually transmitted disease) 10/14/2018  . Asthma in adult 08/23/2017  . Seasonal allergic rhinitis due to pollen 08/23/2017  . Healthcare maintenance 08/20/2017  . Elevated AST (SGOT) 08/20/2017   Brief Narrative:  Jackson [name] is a 19 y.o. AA male with HIV infection. Originally diagnosed in 07/19/2017 after seeking STI testing. CD4 nadir 570. History of OIs: none. HIV Risk: MSM.   Previous Regimens:   Biktarvy 2019  Genotype:   07/2017 - no mutations   SUBJECTIVE  CC:  HIV follow up care.    HPI:  Graduated recently and feels much better and less stressed. Back at home and now feels "relieved" more after these are done. 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   He has continued his Biktarvy once a day as prescribed. He has no concern for access to his medications or side effects to medication.  His mood has been better lately. Eating and sleeping well.    Review of Systems  Constitutional: Negative for chills and fever.  HENT: Negative for tinnitus.   Eyes: Negative for blurred vision and photophobia.  Respiratory: Negative for cough and sputum production.   Cardiovascular: Negative for chest pain.  Gastrointestinal: Negative for diarrhea, nausea and vomiting.  Genitourinary: Negative for dysuria.  Skin: Negative for rash.  Neurological: Negative for headaches.  Psychiatric/Behavioral: Negative for depression and substance abuse. The patient has insomnia. The patient is not nervous/anxious.      Past Medical History:  Diagnosis Date  . Asthma   . HIV infection [facility])     Outpatient Medications Prior to Visit  Medication Sig Dispense Refill  . albuterol (PROVENTIL HFA;VENTOLIN  HFA) 108 (90 Base) MCG/ACT inhaler Inhale 2 puffs into the lungs every 4 (four) hours as needed for wheezing or shortness of breath. 1 Inhaler 3  . cetirizine (ZYRTEC) 10 MG tablet Take 10 mg by mouth daily.    . fluticasone (FLOVENT HFA) 44 MCG/ACT inhaler Inhale 2 puffs into the lungs 2 (two) times daily. For 1-2 weeks or until breathing returns to baseline. 1 Inhaler 3  . ipratropium (ATROVENT) 0.03 % nasal spray Place 2 sprays into both nostrils 2 (two) times daily. As needed 30 mL 12  . bictegravir-emtricitabine-tenofovir AF (BIKTARVY) 50-200-25 MG TABS tablet Take 1 tablet by mouth daily. 30 tablet 11   No facility-administered medications prior to visit.   Allergies  Allergen Reactions  . Amoxicillin Hives    Social History   Tobacco Use  . Smoking status: Never Smoker  . Smokeless tobacco: Never Used  Vaping Use  . Vaping Use: Never used  Substance Use Topics  . Alcohol use: Yes    Comment: OCC  . Drug use: Yes    Frequency: 7.0 times per week    Types: Marijuana    Family History  Problem Relation Age of Onset  . Healthy Mother     Social History   Substance and Sexual Activity  Sexual Activity Not Currently  . Partners: Female, Male  . Birth control/protection: Condom   Comment: given condoms    OBJECTIVE: Vitals:   03/09/20 1112  BP: 119/75  Pulse: 71  Weight: 200 lb (90.7 kg)   Body  mass index is 29.53 kg/m.  Physical Exam Vitals reviewed.  Constitutional:      Appearance: Normal appearance. He is not ill-appearing.  HENT:     Head: Normocephalic.     Mouth/Throat:     Mouth: Mucous membranes are moist.     Pharynx: Oropharynx is clear.  Eyes:     General: No scleral icterus. Pulmonary:     Effort: Pulmonary effort is normal.  Musculoskeletal:        General: Normal range of motion.     Cervical back: Normal range of motion.  Skin:    Coloration: Skin is not jaundiced or pale.  Neurological:     Mental Status: He is alert and oriented  to person, place, and time.  Psychiatric:        Mood and Affect: Mood normal.        Judgment: Judgment normal.     Lab Results Lab Results  Component Value Date   WBC 7.7 10/29/2019   HGB 15.5 10/29/2019   HCT 44.8 10/29/2019   MCV 87.5 10/29/2019   PLT 283 10/29/2019    Lab Results  Component Value Date   CREATININE 1.08 10/29/2019   BUN 10 10/29/2019   NA 139 10/29/2019   K 4.0 10/29/2019   CL 105 10/29/2019   CO2 27 10/29/2019    Lab Results  Component Value Date   ALT 34 10/29/2019   AST 29 10/29/2019   BILITOT 0.6 10/29/2019    Lab Results  Component Value Date   CHOL 154 10/14/2018   HDL 57 10/14/2018   LDLCALC 80 10/14/2018   TRIG 89 10/14/2018   CHOLHDL 2.7 10/14/2018   HIV 1 RNA Quant (copies/mL)  Date Value  10/29/2019 <20 NOT DETECTED  06/02/2019 <20 NOT DETECTED  10/14/2018 <20 DETECTED (A)   CD4 T Cell Abs (/uL)  Date Value  10/29/2019 894  10/14/2018 590  05/28/2018 700    ASSESSMENT & PLAN: Problem List Items Addressed This Visit      High   HIV (human immunodeficiency virus infection) (HCC) - Primary (Chronic)    Jackson [name] continues to do very well on [place]. Will update pertinent labs today and have him back for routine care in 6 months.  He has access to his medications and was given refills today.  He is up to date for vaccines including COVID19 with Pfizer vaccine  Declined STI testing today.       Relevant Medications   bictegravir-emtricitabine-tenofovir AF (BIKTARVY) 50-200-25 MG TABS tablet   Other Relevant Orders   HIV-1 RNA quant-no reflex-bld   T-helper cell (CD4)- ([facility] clinic only)     Unprioritized   Depressed mood    Improved without intervention after environmental stressors have been removed. Once school starts up again may do well to talk with Jackson [name] for some counseling sessions. He will schedule if there is a need.          Jackson Alberts, MSN, NP-C [facility] for Infectious Disease [facility] Pager: [phone]  03/09/2020

## 2020-03-09 NOTE — Assessment & Plan Note (Signed)
Jackson [name] continues to do very well on [place]. Will update pertinent labs today and have him back for routine care in 6 months.  He has access to his medications and was given refills today.  He is up to date for vaccines including COVID19 with Pfizer vaccine  Declined STI testing today.

## 2020-03-09 NOTE — Assessment & Plan Note (Signed)
Improved without intervention after environmental stressors have been removed. Once school starts up again may do well to talk with Marylu Lund for some counseling sessions. He will schedule if there is a need.

## 2020-03-09 NOTE — Patient Instructions (Addendum)
Please continue your Biktarvy once a day.   Please stop by the lab on your way out.   Would recommend flu shot in the fall (Sometime around October).

## 2020-03-09 NOTE — Telephone Encounter (Signed)
Patient's immunization record updated, printed, and placed at front desk for pick up. Andree Coss, RN

## 2020-03-10 LAB — T-HELPER CELL (CD4) - (RCID CLINIC ONLY)
CD4 % Helper T Cell: 35 % (ref 33–65)
CD4 T Cell Abs: 792 /uL (ref 400–1790)

## 2020-03-12 LAB — HIV-1 RNA QUANT-NO REFLEX-BLD
HIV 1 RNA Quant: <20 copies/mL
HIV-1 RNA Quant, Log: <1.3 Log copies/mL

## 2020-05-10 DIAGNOSIS — Z23 Encounter for immunization: Secondary | ICD-10-CM | POA: Diagnosis not present

## 2020-05-20 DIAGNOSIS — Z20822 Contact with and (suspected) exposure to covid-19: Secondary | ICD-10-CM | POA: Diagnosis not present

## 2020-09-20 ENCOUNTER — Other Ambulatory Visit: Payer: Self-pay

## 2020-09-20 ENCOUNTER — Ambulatory Visit (INDEPENDENT_AMBULATORY_CARE_PROVIDER_SITE_OTHER): Payer: BC Managed Care – PPO | Admitting: Infectious Diseases

## 2020-09-20 ENCOUNTER — Encounter: Payer: Self-pay | Admitting: Infectious Diseases

## 2020-09-20 VITALS — BP 121/77 | HR 80 | Temp 97.8°F | Wt 202.0 lb

## 2020-09-20 DIAGNOSIS — Z23 Encounter for immunization: Secondary | ICD-10-CM

## 2020-09-20 DIAGNOSIS — Z21 Asymptomatic human immunodeficiency virus [HIV] infection status: Secondary | ICD-10-CM

## 2020-09-20 DIAGNOSIS — Z0000 Encounter for general adult medical examination without abnormal findings: Secondary | ICD-10-CM | POA: Diagnosis not present

## 2020-09-20 NOTE — Patient Instructions (Addendum)
Always nice to see you.   Will have you stop by the lab to update your blood work on your way out.   We gave you your covid booster and flu shot today. Would do either tylenol or ibuprofen if you have any side effects or sore arm from your shot.   For primary care: [facility] - Call to schedule a new patient visit  [address] [place], [place] [address] Main Line: [phone] Hours (M-F): 8am - 5pm  Will see you back in 6 months.   Will have the research team reach out to you regarding the new study I think you would qualify for.

## 2020-09-20 NOTE — Progress Notes (Signed)
Name: Jackson [name]  DOB: Jun 22, 2001  MRN: [id number] Patient, No Pcp Per    Brief Narrative:  Izaiyah is a 20 y.o. AA male with HIV infection. Originally diagnosed in 07/19/2017 after seeking STI testing. CD4 nadir 570. History of OIs: none. HIV Risk: MSM.   Previous Regimens:   Biktarvy 2019  Genotype:   07/2017 - no mutations   SUBJECTIVE  Chief Complaint  Patient presents with  . Follow-up    B20      HPI:  Graduated recently and feels much better and less stressed. 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  Needs flu and covid booster today if we can. Asking for a PCP recommendation.   He has continued his Biktarvy once a day as prescribed. He has no concern for access to his medications or side effects to medication.  His mood has been better lately. Eating and sleeping well.  Weight overall stable but he noticed that he has gained weight since Biktarvy and has had a hard time losing it.   Wt Readings from Last 3 Encounters:  10/22/20 199 lb (90.3 kg)  09/29/20 202 lb 2 oz (91.7 kg)  09/20/20 202 lb (91.6 kg)     Review of Systems  Constitutional: Negative for chills and fever.  HENT: Negative for sore throat.        No dental problems  Respiratory: Negative for cough.   Cardiovascular: Negative for chest pain and leg swelling.  Gastrointestinal: Negative for abdominal pain, diarrhea and vomiting.  Genitourinary: Negative for dysuria and flank pain.  Musculoskeletal: Negative for myalgias and neck pain.  Skin: Negative for rash.  Neurological: Negative for dizziness and headaches.  Psychiatric/Behavioral: The patient is not nervous/anxious.         Past Medical History:  Diagnosis Date  . Asthma   . HIV infection [facility])     Outpatient Medications Prior to Visit  Medication Sig Dispense Refill  . albuterol (PROVENTIL HFA;VENTOLIN HFA) 108 (90 Base) MCG/ACT inhaler Inhale 2 puffs into the lungs every 4 (four) hours as  needed for wheezing or shortness of breath. 1 Inhaler 3  . cetirizine (ZYRTEC) 10 MG tablet Take 10 mg by mouth daily.    . fluticasone (FLOVENT HFA) 44 MCG/ACT inhaler Inhale 2 puffs into the lungs 2 (two) times daily. For 1-2 weeks or until breathing returns to baseline. 1 Inhaler 3  . bictegravir-emtricitabine-tenofovir AF (BIKTARVY) 50-200-25 MG TABS tablet Take 1 tablet by mouth daily. 30 tablet 11  . ipratropium (ATROVENT) 0.03 % nasal spray Place 2 sprays into both nostrils 2 (two) times daily. As needed 30 mL 12   No facility-administered medications prior to visit.   Allergies  Allergen Reactions  . Amoxicillin Hives    Social History   Tobacco Use  . Smoking status: Never Smoker  . Smokeless tobacco: Never Used  Vaping Use  . Vaping Use: Never used  Substance Use Topics  . Alcohol use: Yes    Comment: OCC  . Drug use: Yes    Frequency: 7.0 times per week    Types: Marijuana    Family History  Problem Relation Age of Onset  . Healthy Mother     Social History   Substance and Sexual Activity  Sexual Activity Not Currently  . Partners: Female, Male  . Birth control/protection: Condom   Comment: given condoms 09/2020    OBJECTIVE: Vitals:   09/20/20 1620  BP: 121/77  Pulse: 80  Temp: 97.8 F (36.6 C)  TempSrc: Oral  Weight: 202 lb (91.6 kg)   Body mass index is 29.83 kg/m.  Physical Exam Constitutional:      Appearance: He is well-developed.     Comments: Seated comfortably in chair during visit.   HENT:     Mouth/Throat:     Dentition: Normal dentition. No dental abscesses.  Cardiovascular:     Rate and Rhythm: Normal rate and regular rhythm.     Heart sounds: Normal heart sounds.  Pulmonary:     Effort: Pulmonary effort is normal.     Breath sounds: Normal breath sounds.  Abdominal:     General: There is no distension.     Palpations: Abdomen is soft.     Tenderness: There is no abdominal tenderness.  Lymphadenopathy:     Cervical: No  cervical adenopathy.  Skin:    General: Skin is warm and dry.     Findings: No rash.  Neurological:     Mental Status: He is alert and oriented to person, place, and time.  Psychiatric:        Judgment: Judgment normal.     Comments: In good spirits today and engaged in care discussion.       Lab Results Lab Results  Component Value Date   WBC 7.1 09/30/2020   HGB 16.1 09/30/2020   HCT 47 09/30/2020   MCV 87.4 09/20/2020   PLT 283 09/30/2020    Lab Results  Component Value Date   CREATININE 1.05 10/22/2020   BUN 15 10/22/2020   NA 137 10/22/2020   K 4.1 10/22/2020   CL 105 10/22/2020   CO2 23 10/22/2020    Lab Results  Component Value Date   ALT 24 10/22/2020   AST 23 10/22/2020   BILITOT 1.3 (H) 10/22/2020    Lab Results  Component Value Date   CHOL 157 10/22/2020   HDL 53 10/22/2020   LDLCALC 89 10/22/2020   TRIG 67 10/22/2020   CHOLHDL 3.0 10/22/2020   HIV 1 RNA Quant  Date Value  09/29/2020 <20 Copies/mL  09/20/2020 <20 Copies/mL  03/09/2020 <20 NOT DETECTED copies/mL   CD4 T Cell Abs (/uL)  Date Value  09/20/2020 944  03/09/2020 792  10/29/2019 894    ASSESSMENT & PLAN:  Patient Active Problem List   Diagnosis Date Noted  . HIV (human immunodeficiency virus infection) (HCC) 08/20/2017  . Depressed mood 06/02/2019  . Screen for STD (sexually transmitted disease) 10/14/2018  . Asthma in adult 08/23/2017  . Seasonal allergic rhinitis due to pollen 08/23/2017  . Healthcare maintenance 08/20/2017  . Elevated AST (SGOT) 08/20/2017    Problem List Items Addressed This Visit      High   HIV (human immunodeficiency virus infection) (HCC) (Chronic)    Doing well overall on Biktarvy once a day. Reviewed historical labs together and reviewed progress and recovery.  He has noticed some weight gain - we discussed that this may be in part to his medication. Will refer to research team for DO-IT trial.  STI screen declined today.  Vaccinations  provided.   Return in about 6 months (around 03/20/2021). labs same day         Unprioritized   Healthcare maintenance    Vaccine counseling provided re: COVID booster and flu shot today.        Other Visit Diagnoses    Asymptomatic HIV infection (HCC)    -  Primary   Relevant Orders  HIV-1 RNA quant-no reflex-bld (Completed)   T-helper cell (CD4)- ([facility] clinic only) (Completed)   COMPLETE METABOLIC PANEL WITH GFR (Completed)   CBC with Differential/Platelet (Completed)   Encounter for immunization       Relevant Orders   PFIZER Comirnaty(GRAY TOP)COVID-19 Vaccine (Completed)   Need for immunization against influenza       Relevant Orders   Flu Vaccine QUAD 36+ mos IM (Completed)      Rexene Alberts, MSN, NP-C [facility] Pager: [phone]  10/29/2020

## 2020-09-20 NOTE — Progress Notes (Signed)
   Covid-19 Vaccination Clinic  Name:  Jackson [name]    MRN: [id number] DOB: 05-13-2001  09/20/2020  Jackson [name] was observed post Covid-19 immunization for 15 minutes without incident. He was provided with Vaccine Information Sheet and instruction to access the V-Safe system.   Jackson [name] was instructed to call 911 with any severe reactions post vaccine: Marland Kitchen Difficulty breathing  . Swelling of face and throat  . A fast heartbeat  . A bad rash all over body  . Dizziness and weakness     Ailana Cuadrado T Pricilla Loveless

## 2020-09-21 LAB — T-HELPER CELL (CD4) - (RCID CLINIC ONLY)
CD4 % Helper T Cell: 38 % (ref 33–65)
CD4 T Cell Abs: 944 /uL (ref 400–1790)

## 2020-09-22 LAB — COMPLETE METABOLIC PANEL WITH GFR
AG Ratio: 1.3 (calc) (ref 1.0–2.5)
ALT: 26 U/L (ref 9–46)
AST: 21 U/L (ref 10–40)
Albumin: 4.4 g/dL (ref 3.6–5.1)
Alkaline phosphatase (APISO): 78 U/L (ref 36–130)
BUN: 13 mg/dL (ref 7–25)
CO2: 30 mmol/L (ref 20–32)
Calcium: 9.6 mg/dL (ref 8.6–10.3)
Chloride: 105 mmol/L (ref 98–110)
Creat: 1.01 mg/dL (ref 0.60–1.35)
GFR, Est African American: 123 mL/min/{1.73_m2} (ref >=60)
GFR, Est Non African American: 106 mL/min/{1.73_m2} (ref >=60)
Globulin: 3.4 g/dL (calc) (ref 1.9–3.7)
Glucose, Bld: 85 mg/dL (ref 65–99)
Potassium: 3.8 mmol/L (ref 3.5–5.3)
Sodium: 141 mmol/L (ref 135–146)
Total Bilirubin: 0.6 mg/dL (ref 0.2–1.2)
Total Protein: 7.8 g/dL (ref 6.1–8.1)

## 2020-09-22 LAB — CBC WITH DIFFERENTIAL/PLATELET
Absolute Monocytes: 722 cells/uL (ref 200–950)
Basophils Absolute: 33 cells/uL (ref 0–200)
Basophils Relative: 0.4 %
Eosinophils Absolute: 66 cells/uL (ref 15–500)
Eosinophils Relative: 0.8 %
HCT: 45.6 % (ref 38.5–50.0)
Hemoglobin: 15.5 g/dL (ref 13.2–17.1)
Lymphs Abs: 2731 cells/uL (ref 850–3900)
MCH: 29.7 pg (ref 27.0–33.0)
MCHC: 34 g/dL (ref 32.0–36.0)
MCV: 87.4 fL (ref 80.0–100.0)
MPV: 10.3 fL (ref 7.5–12.5)
Monocytes Relative: 8.7 %
Neutro Abs: 4748 cells/uL (ref 1500–7800)
Neutrophils Relative %: 57.2 %
Platelets: 284 10*3/uL (ref 140–400)
RBC: 5.22 10*6/uL (ref 4.20–5.80)
RDW: 12.7 % (ref 11.0–15.0)
Total Lymphocyte: 32.9 %
WBC: 8.3 10*3/uL (ref 3.8–10.8)

## 2020-09-22 LAB — HIV-1 RNA QUANT-NO REFLEX-BLD
HIV 1 RNA Quant: <20 Copies/mL
HIV-1 RNA Quant, Log: <1.3 Log cps/mL

## 2020-09-28 ENCOUNTER — Encounter: Payer: BC Managed Care – PPO | Admitting: *Deleted

## 2020-09-29 ENCOUNTER — Encounter (INDEPENDENT_AMBULATORY_CARE_PROVIDER_SITE_OTHER): Payer: Self-pay | Admitting: *Deleted

## 2020-09-29 ENCOUNTER — Other Ambulatory Visit: Payer: Self-pay

## 2020-09-29 VITALS — BP 126/81 | HR 77 | Temp 98.3°F | Ht 68.5 in | Wt 202.1 lb

## 2020-09-29 DIAGNOSIS — Z006 Encounter for examination for normal comparison and control in clinical research program: Secondary | ICD-10-CM

## 2020-09-30 LAB — CBC AND DIFFERENTIAL
HCT: 47 (ref 41–53)
Hemoglobin: 16.1 (ref 13.5–17.5)
Neutrophils Absolute: 4.1
Platelets: 283 (ref 150–399)
WBC: 7.1

## 2020-09-30 LAB — CD4/CD8 (T-HELPER/T-SUPPRESSOR CELL)
Absolute CD4: 842
CD4%: 35.1
CD8 % Suppressor T Cell: 35.2
CD8 T Cell Abs: 845

## 2020-09-30 LAB — CBC: RBC: 5.38 — AB (ref 3.87–5.11)

## 2020-09-30 NOTE — Research (Signed)
Jackson [name] was here to screen for the A5391 DO-IT study. He had taken the consent home and had a chance to review before the visit. Informed consent was obtained prior to any other procedures and we had discussed the study in detail and answered all his questions.  He has had a 19% weight gain after starting Biktarvy 3 years ago and had no other contributing factors to account for it. His only other health problems were depression around the time he was diagnosed, which is not present now, asthma as a child and a torn meniscus that required surgery a few yrs. ago. His BMI is >30% currently and prior genotype did not show any mutations that would make him ineligible for the study. He is allergic to amoxicillin. We have planned entry for 10/22/20 and will schedule the DEXA scan when he is confirmed eligible.

## 2020-10-01 LAB — COMPREHENSIVE METABOLIC PANEL
AG Ratio: 1.3 (calc) (ref 1.0–2.5)
ALT: 35 U/L (ref 9–46)
AST: 23 U/L (ref 10–40)
Albumin: 4.5 g/dL (ref 3.6–5.1)
Alkaline phosphatase (APISO): 87 U/L (ref 36–130)
BUN: 15 mg/dL (ref 7–25)
CO2: 25 mmol/L (ref 20–32)
Calcium: 9.4 mg/dL (ref 8.6–10.3)
Chloride: 102 mmol/L (ref 98–110)
Creat: 1.09 mg/dL (ref 0.60–1.35)
Globulin: 3.5 g/dL (calc) (ref 1.9–3.7)
Glucose, Bld: 89 mg/dL (ref 65–99)
Potassium: 4 mmol/L (ref 3.5–5.3)
Sodium: 137 mmol/L (ref 135–146)
Total Bilirubin: 0.9 mg/dL (ref 0.2–1.2)
Total Protein: 8 g/dL (ref 6.1–8.1)

## 2020-10-01 LAB — HIV-1 RNA QUANT-NO REFLEX-BLD
HIV 1 RNA Quant: <20 Copies/mL
HIV-1 RNA Quant, Log: <1.3 Log cps/mL

## 2020-10-01 LAB — PHOSPHORUS: Phosphorus: 3.7 mg/dL (ref 2.5–4.5)

## 2020-10-01 LAB — BILIRUBIN, DIRECT: Bilirubin, Direct: 0.1 mg/dL (ref 0.0–0.2)

## 2020-10-22 ENCOUNTER — Telehealth: Payer: Self-pay

## 2020-10-22 ENCOUNTER — Other Ambulatory Visit: Payer: Self-pay | Admitting: Physician Assistant

## 2020-10-22 ENCOUNTER — Other Ambulatory Visit: Payer: Self-pay | Admitting: *Deleted

## 2020-10-22 ENCOUNTER — Encounter (INDEPENDENT_AMBULATORY_CARE_PROVIDER_SITE_OTHER): Payer: Self-pay | Admitting: *Deleted

## 2020-10-22 ENCOUNTER — Other Ambulatory Visit: Payer: Self-pay

## 2020-10-22 VITALS — BP 116/71 | HR 68 | Temp 98.1°F | Wt 199.0 lb

## 2020-10-22 DIAGNOSIS — Z21 Asymptomatic human immunodeficiency virus [HIV] infection status: Secondary | ICD-10-CM

## 2020-10-22 DIAGNOSIS — Z006 Encounter for examination for normal comparison and control in clinical research program: Secondary | ICD-10-CM

## 2020-10-22 MED ORDER — STUDY - DO-IT A5391 - DORAVIRINE (PIFELTRO) 100MG TABLET (PI-VAN DAM): 100.0000 mg | ORAL_TABLET | Freq: Every day | ORAL | Status: DC

## 2020-10-22 MED ORDER — EMTRICITABINE-TENOFOVIR AF 200-25 MG PO TABS: 1.0000 | ORAL_TABLET | Freq: Every day | ORAL | 11 refills | Status: DC

## 2020-10-22 MED FILL — DESCOVY 200-25 MG TABS: 200-25 | 30 days supply | Qty: 30 | Fill #0

## 2020-10-22 NOTE — Research (Signed)
Jackson [name] enrolled on the DO-IT study today after his eligibility was verified. He denied any new health problems or medications. He was randomized to receive Doravirine 100mg  and Descovy daily. Study provided the doravirine and the descovy was ordered by through the pharmacy and his insurance. Jackson [name] has set it up to be mailed from the [facility], and he should receive it on Monday. I told him to wait until he had the descovy and then start both the doravirine and the descovy together and stop the [place]. The pharmacist did tell him what side effects to be aware of and he is to call [facility] for any bothersome SEs. He is going to go over for his DEXA scan now at [facility] and return to see [facility] in 4 weeks.

## 2020-10-22 NOTE — Addendum Note (Signed)
Addended by: Phill Myron on: 10/22/2020 12:09 PM   Modules accepted: Orders, SmartSet

## 2020-10-22 NOTE — Telephone Encounter (Signed)
[facility] Patient Advocate Encounter °  °Was successful in obtaining a [employer] copay card for Descovy.  This copay card will make the patients copay 0.00. ° °I have spoken with the patient.   ° °The billing information is as follows and has been shared with [facility]. ° ° ° ° ° ° °Dylon Correa, CPhT °Specialty Pharmacy Patient Advocate °[facility] °Phone: [phone] °Fax:  [phone]  °

## 2020-10-23 LAB — URINALYSIS, ROUTINE W REFLEX MICROSCOPIC
Bacteria, UA: NONE SEEN /HPF
Bilirubin Urine: NEGATIVE
Glucose, UA: NEGATIVE
Hgb urine dipstick: NEGATIVE
Hyaline Cast: NONE SEEN /LPF
Nitrite: NEGATIVE
Protein, ur: NEGATIVE
RBC / HPF: NONE SEEN /HPF (ref 0–2)
Specific Gravity, Urine: 1.029 (ref 1.001–1.03)
Squamous Epithelial / HPF: NONE SEEN /HPF (ref <=5)
pH: 5.5 (ref 5.0–8.0)

## 2020-10-23 LAB — COMPREHENSIVE METABOLIC PANEL
AG Ratio: 1.3 (calc) (ref 1.0–2.5)
ALT: 24 U/L (ref 9–46)
AST: 23 U/L (ref 10–40)
Albumin: 4.5 g/dL (ref 3.6–5.1)
Alkaline phosphatase (APISO): 79 U/L (ref 36–130)
BUN: 15 mg/dL (ref 7–25)
CO2: 23 mmol/L (ref 20–32)
Calcium: 9.5 mg/dL (ref 8.6–10.3)
Chloride: 105 mmol/L (ref 98–110)
Creat: 1.05 mg/dL (ref 0.60–1.35)
Globulin: 3.4 g/dL (calc) (ref 1.9–3.7)
Glucose, Bld: 75 mg/dL (ref 65–99)
Potassium: 4.1 mmol/L (ref 3.5–5.3)
Sodium: 137 mmol/L (ref 135–146)
Total Bilirubin: 1.3 mg/dL — ABNORMAL HIGH (ref 0.2–1.2)
Total Protein: 7.9 g/dL (ref 6.1–8.1)

## 2020-10-23 LAB — LIPID PANEL
Cholesterol: 157 mg/dL (ref <200)
HDL: 53 mg/dL (ref >=40)
LDL Cholesterol (Calc): 89 mg/dL (calc)
Non-HDL Cholesterol (Calc): 104 mg/dL (calc) (ref <130)
Total CHOL/HDL Ratio: 3 (calc) (ref <5.0)
Triglycerides: 67 mg/dL (ref <150)

## 2020-10-23 LAB — PHOSPHORUS: Phosphorus: 4 mg/dL (ref 2.5–4.5)

## 2020-10-23 LAB — BILIRUBIN, DIRECT: Bilirubin, Direct: 0.3 mg/dL — ABNORMAL HIGH (ref 0.0–0.2)

## 2020-10-25 LAB — CBC AND DIFFERENTIAL
HCT: 46 (ref 41–53)
Hemoglobin: 15.7 (ref 13.5–17.5)
Neutrophils Absolute: 4.1
Platelets: 253 (ref 150–399)
WBC: 7

## 2020-10-25 LAB — HEMOGLOBIN A1C: Hemoglobin A1C: 5.4

## 2020-10-25 LAB — CD4/CD8 (T-HELPER/T-SUPPRESSOR CELL)
CD4 T Cell Abs: 764
CD4%: 33.2
CD8 % Suppressor T Cell: 835

## 2020-10-25 LAB — CBC: RBC: 5.32 — AB (ref 3.87–5.11)

## 2020-10-29 NOTE — Assessment & Plan Note (Signed)
Vaccine counseling provided re: COVID booster and flu shot today.

## 2020-10-29 NOTE — Assessment & Plan Note (Signed)
Doing well overall on Biktarvy once a day. Reviewed historical labs together and reviewed progress and recovery.  He has noticed some weight gain - we discussed that this may be in part to his medication. Will refer to research team for DO-IT trial.  STI screen declined today.  Vaccinations provided.   Return in about 6 months (around 03/20/2021). labs same day

## 2020-11-01 LAB — HIV-1 RNA QUANT-NO REFLEX-BLD
Copies/mL: <40
Log copies/ml (ULTRA): <1.6

## 2020-11-05 ENCOUNTER — Other Ambulatory Visit (HOSPITAL_COMMUNITY): Payer: Self-pay

## 2020-11-15 ENCOUNTER — Other Ambulatory Visit (HOSPITAL_COMMUNITY): Payer: Self-pay

## 2020-11-19 ENCOUNTER — Other Ambulatory Visit (HOSPITAL_COMMUNITY): Payer: Self-pay

## 2020-11-19 ENCOUNTER — Encounter: Payer: BC Managed Care – PPO | Admitting: *Deleted

## 2020-11-22 ENCOUNTER — Other Ambulatory Visit (HOSPITAL_COMMUNITY): Payer: Self-pay

## 2020-11-23 ENCOUNTER — Encounter: Payer: BC Managed Care – PPO | Admitting: *Deleted

## 2020-11-24 ENCOUNTER — Other Ambulatory Visit: Payer: Self-pay

## 2020-11-24 ENCOUNTER — Encounter (INDEPENDENT_AMBULATORY_CARE_PROVIDER_SITE_OTHER): Payer: Self-pay | Admitting: *Deleted

## 2020-11-24 VITALS — BP 106/72 | HR 66 | Temp 98.2°F | Wt 201.4 lb

## 2020-11-24 DIAGNOSIS — Z006 Encounter for examination for normal comparison and control in clinical research program: Secondary | ICD-10-CM

## 2020-11-24 NOTE — Research (Signed)
Jackson [name] was here for his week 4 visit for the DO -It study. He denies any new problems or side effects from the doravirine and has been 100% adherent. He will be returning in 8 weeks.

## 2020-11-25 LAB — COMPREHENSIVE METABOLIC PANEL
AG Ratio: 1.2 (calc) (ref 1.0–2.5)
ALT: 30 U/L (ref 9–46)
AST: 23 U/L (ref 10–40)
Albumin: 4.3 g/dL (ref 3.6–5.1)
Alkaline phosphatase (APISO): 81 U/L (ref 36–130)
BUN: 12 mg/dL (ref 7–25)
CO2: 26 mmol/L (ref 20–32)
Calcium: 9.7 mg/dL (ref 8.6–10.3)
Chloride: 104 mmol/L (ref 98–110)
Creat: 0.99 mg/dL (ref 0.60–1.35)
Globulin: 3.6 g/dL (calc) (ref 1.9–3.7)
Glucose, Bld: 109 mg/dL — ABNORMAL HIGH (ref 65–99)
Potassium: 4.2 mmol/L (ref 3.5–5.3)
Sodium: 138 mmol/L (ref 135–146)
Total Bilirubin: 0.9 mg/dL (ref 0.2–1.2)
Total Protein: 7.9 g/dL (ref 6.1–8.1)

## 2020-11-25 LAB — HEPATIC FUNCTION PANEL
AG Ratio: 1.2 (calc) (ref 1.0–2.5)
ALT: 30 U/L (ref 9–46)
AST: 23 U/L (ref 10–40)
Albumin: 4.3 g/dL (ref 3.6–5.1)
Alkaline phosphatase (APISO): 81 U/L (ref 36–130)
Bilirubin, Direct: 0.1 mg/dL (ref 0.0–0.2)
Globulin: 3.6 g/dL (calc) (ref 1.9–3.7)
Indirect Bilirubin: 0.8 mg/dL (calc) (ref 0.2–1.2)
Total Bilirubin: 0.9 mg/dL (ref 0.2–1.2)
Total Protein: 7.9 g/dL (ref 6.1–8.1)

## 2020-11-25 LAB — PHOSPHORUS: Phosphorus: 3.1 mg/dL (ref 2.5–4.5)

## 2020-12-06 LAB — HIV-1 RNA QUANT-NO REFLEX-BLD
Copies/mL: <40
Log copies/ml (ULTRA): <1.6

## 2020-12-30 ENCOUNTER — Other Ambulatory Visit (HOSPITAL_COMMUNITY): Payer: Self-pay

## 2020-12-31 ENCOUNTER — Other Ambulatory Visit (HOSPITAL_COMMUNITY): Payer: Self-pay

## 2020-12-31 MED FILL — Emtricitabine-Tenofovir Alafenamide Fumarate Tab 200-25 MG: ORAL | 30 days supply | Qty: 30 | Fill #0 | Status: AC

## 2021-01-07 ENCOUNTER — Other Ambulatory Visit (HOSPITAL_COMMUNITY): Payer: Self-pay

## 2021-01-07 MED ORDER — STUDY - DO-IT A5391 - DORAVIRINE (PIFELTRO) 100MG TABLET (PI-VAN DAM)
ORAL_TABLET | ORAL | 0 refills | Status: DC
  Filled 2021-01-07: qty 90, 90d supply, fill #0

## 2021-01-13 ENCOUNTER — Other Ambulatory Visit (HOSPITAL_COMMUNITY): Payer: Self-pay

## 2021-01-13 ENCOUNTER — Encounter (INDEPENDENT_AMBULATORY_CARE_PROVIDER_SITE_OTHER): Payer: Self-pay | Admitting: *Deleted

## 2021-01-13 ENCOUNTER — Other Ambulatory Visit: Payer: Self-pay

## 2021-01-13 VITALS — BP 129/74 | HR 85 | Temp 98.2°F | Wt 201.5 lb

## 2021-01-13 DIAGNOSIS — Z006 Encounter for examination for normal comparison and control in clinical research program: Secondary | ICD-10-CM

## 2021-01-13 NOTE — Research (Signed)
Jackson [name] was here for his week 12 visit for the Do-It study. He denies any new problems or medications. He said he did runout of his descovy for a day until he got it filled. He will be returning in 12 weeks.

## 2021-01-14 ENCOUNTER — Other Ambulatory Visit (HOSPITAL_COMMUNITY): Payer: Self-pay

## 2021-01-14 LAB — COMPREHENSIVE METABOLIC PANEL
AG Ratio: 1.3 (calc) (ref 1.0–2.5)
ALT: 29 U/L (ref 9–46)
AST: 27 U/L (ref 10–40)
Albumin: 4.4 g/dL (ref 3.6–5.1)
Alkaline phosphatase (APISO): 75 U/L (ref 36–130)
BUN: 17 mg/dL (ref 7–25)
CO2: 24 mmol/L (ref 20–32)
Calcium: 9.3 mg/dL (ref 8.6–10.3)
Chloride: 103 mmol/L (ref 98–110)
Creat: 1.06 mg/dL (ref 0.60–1.35)
Globulin: 3.5 g/dL (calc) (ref 1.9–3.7)
Glucose, Bld: 132 mg/dL — ABNORMAL HIGH (ref 65–99)
Potassium: 3.9 mmol/L (ref 3.5–5.3)
Sodium: 136 mmol/L (ref 135–146)
Total Bilirubin: 1 mg/dL (ref 0.2–1.2)
Total Protein: 7.9 g/dL (ref 6.1–8.1)

## 2021-01-14 LAB — BILIRUBIN, DIRECT: Bilirubin, Direct: 0.2 mg/dL (ref 0.0–0.2)

## 2021-01-14 LAB — PHOSPHORUS: Phosphorus: 3.2 mg/dL (ref 2.5–4.5)

## 2021-01-24 ENCOUNTER — Other Ambulatory Visit (HOSPITAL_COMMUNITY): Payer: Self-pay

## 2021-01-24 LAB — HIV-1 RNA QUANT-NO REFLEX-BLD
Log copies/ml (ULTRA): <1.6
Log copies/ml (ULTRA): <40

## 2021-01-25 ENCOUNTER — Other Ambulatory Visit (HOSPITAL_COMMUNITY): Payer: Self-pay

## 2021-01-26 ENCOUNTER — Other Ambulatory Visit (HOSPITAL_COMMUNITY): Payer: Self-pay

## 2021-02-17 ENCOUNTER — Other Ambulatory Visit (HOSPITAL_COMMUNITY): Payer: Self-pay

## 2021-02-17 MED FILL — Emtricitabine-Tenofovir Alafenamide Fumarate Tab 200-25 MG: ORAL | 30 days supply | Qty: 30 | Fill #1 | Status: AC

## 2021-03-14 ENCOUNTER — Other Ambulatory Visit (HOSPITAL_COMMUNITY): Payer: Self-pay

## 2021-03-16 ENCOUNTER — Other Ambulatory Visit (HOSPITAL_COMMUNITY): Payer: Self-pay

## 2021-03-30 ENCOUNTER — Other Ambulatory Visit (HOSPITAL_COMMUNITY): Payer: Self-pay

## 2021-03-30 MED FILL — Emtricitabine-Tenofovir Alafenamide Fumarate Tab 200-25 MG: ORAL | 30 days supply | Qty: 30 | Fill #2 | Status: AC

## 2021-04-04 ENCOUNTER — Ambulatory Visit: Payer: BC Managed Care – PPO | Admitting: Infectious Diseases

## 2021-04-12 ENCOUNTER — Encounter: Payer: Self-pay | Admitting: Infectious Disease

## 2021-04-12 ENCOUNTER — Other Ambulatory Visit: Payer: Self-pay

## 2021-04-12 ENCOUNTER — Encounter (INDEPENDENT_AMBULATORY_CARE_PROVIDER_SITE_OTHER): Payer: Self-pay | Admitting: *Deleted

## 2021-04-12 ENCOUNTER — Encounter: Payer: BC Managed Care – PPO | Admitting: *Deleted

## 2021-04-12 VITALS — BP 116/74 | HR 63 | Temp 98.6°F | Wt 203.1 lb

## 2021-04-12 DIAGNOSIS — Z006 Encounter for examination for normal comparison and control in clinical research program: Secondary | ICD-10-CM

## 2021-04-12 NOTE — Research (Signed)
Jackson [name] was here for his week 24 visit for A5391. He denies any new meds or problems. He says his adherence is very good.  He forgot to bring his leftover doravirine back so will do that at next visit. He will be returning in 12 weeks.

## 2021-04-13 LAB — PHOSPHORUS: Phosphorus: 3.5 mg/dL (ref 2.5–4.5)

## 2021-04-13 LAB — LIPID PANEL
Cholesterol: 193 mg/dL (ref <200)
HDL: 56 mg/dL (ref >=40)
LDL Cholesterol (Calc): 119 mg/dL (calc) — ABNORMAL HIGH
Non-HDL Cholesterol (Calc): 137 mg/dL (calc) — ABNORMAL HIGH (ref <130)
Total CHOL/HDL Ratio: 3.4 (calc) (ref <5.0)
Triglycerides: 83 mg/dL (ref <150)

## 2021-04-13 LAB — CD4/CD8 (T-HELPER/T-SUPPRESSOR CELL)
CD4 % Helper T Cell: 35.2
CD4 Count: 774
CD8 % Suppressor T Cell: 37.6
CD8 T Cell Abs: 827

## 2021-04-13 LAB — COMPREHENSIVE METABOLIC PANEL
AG Ratio: 1.2 (calc) (ref 1.0–2.5)
ALT: 30 U/L (ref 9–46)
AST: 23 U/L (ref 10–40)
Albumin: 4.5 g/dL (ref 3.6–5.1)
Alkaline phosphatase (APISO): 76 U/L (ref 36–130)
BUN: 12 mg/dL (ref 7–25)
CO2: 24 mmol/L (ref 20–32)
Calcium: 9.6 mg/dL (ref 8.6–10.3)
Chloride: 103 mmol/L (ref 98–110)
Creat: 1.07 mg/dL (ref 0.60–1.24)
Globulin: 3.7 g/dL (calc) (ref 1.9–3.7)
Glucose, Bld: 83 mg/dL (ref 65–99)
Potassium: 4.3 mmol/L (ref 3.5–5.3)
Sodium: 138 mmol/L (ref 135–146)
Total Bilirubin: 0.7 mg/dL (ref 0.2–1.2)
Total Protein: 8.2 g/dL — ABNORMAL HIGH (ref 6.1–8.1)

## 2021-04-13 LAB — BILIRUBIN, DIRECT: Bilirubin, Direct: 0.2 mg/dL (ref 0.0–0.2)

## 2021-04-13 LAB — PROTEIN, URINE, RANDOM: Total Protein, Urine: 66 mg/dL — ABNORMAL HIGH (ref 5–25)

## 2021-04-15 ENCOUNTER — Encounter: Payer: Self-pay | Admitting: *Deleted

## 2021-04-27 ENCOUNTER — Other Ambulatory Visit (HOSPITAL_COMMUNITY): Payer: Self-pay

## 2021-04-29 ENCOUNTER — Encounter: Payer: Self-pay | Admitting: *Deleted

## 2021-04-29 LAB — HIV-1 RNA QUANT-NO REFLEX-BLD: HIV 1 RNA Quant: <40

## 2021-05-02 ENCOUNTER — Other Ambulatory Visit (HOSPITAL_COMMUNITY): Payer: Self-pay

## 2021-05-02 MED FILL — Emtricitabine-Tenofovir Alafenamide Fumarate Tab 200-25 MG: ORAL | 30 days supply | Qty: 30 | Fill #3 | Status: AC

## 2021-05-03 ENCOUNTER — Other Ambulatory Visit: Payer: Self-pay

## 2021-05-03 ENCOUNTER — Encounter: Payer: Self-pay | Admitting: Infectious Diseases

## 2021-05-03 ENCOUNTER — Ambulatory Visit (INDEPENDENT_AMBULATORY_CARE_PROVIDER_SITE_OTHER): Payer: BC Managed Care – PPO | Admitting: Infectious Diseases

## 2021-05-03 ENCOUNTER — Other Ambulatory Visit (HOSPITAL_COMMUNITY)
Admission: RE | Admit: 2021-05-03 | Discharge: 2021-05-03 | Disposition: A | Discharge status: Home / Self Care | Payer: BC Managed Care – PPO | Source: Ambulatory Visit | Attending: Infectious Diseases | Admitting: Infectious Diseases

## 2021-05-03 VITALS — BP 126/77 | HR 79 | Temp 98.3°F | Wt 208.0 lb

## 2021-05-03 DIAGNOSIS — Z113 Encounter for screening for infections with a predominantly sexual mode of transmission: Secondary | ICD-10-CM

## 2021-05-03 DIAGNOSIS — Z21 Asymptomatic human immunodeficiency virus [HIV] infection status: Secondary | ICD-10-CM | POA: Diagnosis not present

## 2021-05-03 DIAGNOSIS — Z23 Encounter for immunization: Secondary | ICD-10-CM

## 2021-05-03 NOTE — Assessment & Plan Note (Signed)
Well controlled on Pifeltro and Descovy taken together once daily with recent VL < 40 and CD4 > 900. Working with [facility] DOIT trial team regularly.  Oropharyngeal screening obtained today - no symptoms and no empiric treatment recommended.  Flu vaccine given HMPX #1 given today with return visit in 4w scheduled.   RTC with me in 6 months - labs same day if needed. Will ask research team to check RPR at next lab draw.

## 2021-05-03 NOTE — Progress Notes (Signed)
    [facility] Vaccination [facility]  Name:  Jackson [name]    MRN: [id number] DOB: [birth year]   05/03/2021  [name] was observed post JYNNEOS immunization for 15 minutes without incident. He was provided with Vaccine Information Sheet and instruction to access the V-Safe system.   [name] was instructed to call 911 with any severe reactions post vaccine: Difficulty breathing  Swelling of face and throat  A fast heartbeat  A bad rash all over body  Dizziness and weakness     [name] T Pricilla Loveless

## 2021-05-03 NOTE — Patient Instructions (Addendum)
Nice to see you today!   We gave you your first monkeypox vaccine today.  Common side effects can include redness, swelling, irritation at the injection site. Put a cool compress over it (like a bag of frozen vegetables and a paper towel) over it to help with the reactions. It should go away in a few days.   We gave you your flu shot today.   Will see you back in 6 months!

## 2021-05-03 NOTE — Progress Notes (Signed)
Name: Jackson [name]  DOB: 2000-08-15  MRN: [id number] Patient, No Pcp Per (Inactive)    Brief Narrative:  Jackson [name] is a 20 y.o. AA male with HIV infection. Originally diagnosed in 07/19/2017 after seeking STI testing. CD4 nadir 570. History of OIs: none. HIV Risk: MSM.   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 Doing well on Pifeltro and Descovy once daily - some adjustment with taking 2 pills vs one. Working with research team now with [facility] trial.   Questions about monkeypox vaccine. Would like flu shot today.   Eating and sleeping well. No concerns over anxious or depressed mood. Routine oropharyngeal screening requested today.     Review of Systems  Constitutional:  Negative for chills and fever.  HENT:  Negative for sore throat.   Eyes:  Negative for visual disturbance.  Gastrointestinal:  Negative for abdominal pain, anal bleeding and rectal pain.  Genitourinary:  Negative for dysuria, genital sores, penile discharge, penile pain, scrotal swelling and testicular pain.  Musculoskeletal:  Negative for arthralgias and joint swelling.  Skin:  Negative for rash.  Neurological:  Negative for headaches.  Hematological:  Negative for adenopathy.       Past Medical History:  Diagnosis Date   Asthma    HIV infection (HCC)     Outpatient Medications Prior to Visit  Medication Sig Dispense Refill   albuterol (PROVENTIL HFA;VENTOLIN HFA) 108 (90 Base) MCG/ACT inhaler Inhale 2 puffs into the lungs every 4 (four) hours as needed for wheezing or shortness of breath. 1 Inhaler 3   cetirizine (ZYRTEC) 10 MG tablet Take 10 mg by mouth daily.     emtricitabine-tenofovir AF (DESCOVY) 200-25 MG tablet TAKE 1 TABLET BY MOUTH DAILY. 30 tablet 11   fluticasone (FLOVENT HFA) 44  MCG/ACT inhaler Inhale 2 puffs into the lungs 2 (two) times daily. For 1-2 weeks or until breathing returns to baseline. 1 Inhaler 3   ipratropium (ATROVENT) 0.03 % nasal spray Place 2 sprays into both nostrils 2 (two) times daily. As needed 30 mL 12   Study - DO-IT A5391 - doravirine (PIFELTRO) 100 mg tablet (PI-[place]) Take 1 tablet (100 mg total) by mouth daily.     Study - DO-IT W0981 - doravirine (PIFELTRO) 100 mg tablet (PI[place]) Take one tablet by mouth once daily with Descovy.  [id number] I ACTG A5391 90 tablet 0   No facility-administered medications prior to visit.   Allergies  Allergen Reactions   Amoxicillin Hives    Social History   Tobacco Use   Smoking status: Never   Smokeless tobacco: Never  Vaping Use   Vaping Use: Never used  Substance Use Topics   Alcohol use: Yes    Comment: OCC   Drug use: Yes    Frequency: 7.0 times per week    Types: Marijuana    Family History  Problem Relation Age of Onset   Healthy Mother     Social History   Substance and Sexual Activity  Sexual Activity Not Currently   Partners: Female, Male   Birth control/protection: Condom   Comment: accepted condoms    OBJECTIVE: Vitals:   05/03/21 1139  BP: 126/77  Pulse: 79  Temp: 98.3 F (36.8 C)  TempSrc: Oral  Weight: 208 lb (94.3 kg)   Body mass index is 31.17 kg/m.  Physical Exam Constitutional:      Appearance: He is well-developed.     Comments: Seated comfortably in chair during visit.   HENT:     Mouth/Throat:     Dentition: Normal dentition. No dental abscesses.  Cardiovascular:     Rate and Rhythm: Normal rate and regular rhythm.     Heart sounds: Normal heart sounds.  Pulmonary:     Effort: Pulmonary effort is normal.     Breath sounds: Normal breath sounds.  Abdominal:     General: There is no distension.     Palpations: Abdomen is soft.     Tenderness: There is no abdominal tenderness.  Lymphadenopathy:     Cervical: No cervical  adenopathy.  Skin:    General: Skin is warm and dry.     Findings: No rash.  Neurological:     Mental Status: He is alert and oriented to person, place, and time.  Psychiatric:        Judgment: Judgment normal.     Comments: In good spirits today and engaged in care discussion.      Lab Results Lab Results  Component Value Date   WBC 7.0 10/25/2020   HGB 15.7 10/25/2020   HCT 46 10/25/2020   MCV 87.4 09/20/2020   PLT 253 10/25/2020    Lab Results  Component Value Date   CREATININE 1.07 04/12/2021   BUN 12 04/12/2021   NA 138 04/12/2021   K 4.3 04/12/2021   CL 103 04/12/2021   CO2 24 04/12/2021    Lab Results  Component Value Date   ALT 30 04/12/2021   AST 23 04/12/2021   BILITOT 0.7 04/12/2021    Lab Results  Component Value Date   CHOL 193 04/12/2021   HDL 56 04/12/2021   LDLCALC 119 (H) 04/12/2021   TRIG 83 04/12/2021   CHOLHDL 3.4 04/12/2021   HIV 1 RNA Quant  Date Value  04/12/2021 <40  09/29/2020 <20 Copies/mL  09/20/2020 <20 Copies/mL   CD4 T Cell Abs  Date Value  10/22/2020 764  09/20/2020 944 /uL  03/09/2020 792 /uL    ASSESSMENT & PLAN:  Patient Active Problem List   Diagnosis Date Noted   HIV (human immunodeficiency virus infection) (HCC) 08/20/2017   Depressed mood 06/02/2019   Screen for STD (sexually transmitted disease) 10/14/2018   Asthma in adult 08/23/2017   Seasonal allergic rhinitis due to pollen 08/23/2017   Healthcare maintenance 08/20/2017   Elevated AST (SGOT) 08/20/2017    Problem List Items Addressed This Visit       High   HIV (human immunodeficiency virus infection) (HCC) (Chronic)    Well controlled on Pifeltro and Descovy taken together once daily with recent VL < 40 and CD4 > 900. Working with [facility] DOIT trial team regularly.  Oropharyngeal screening obtained today - no symptoms and no empiric treatment recommended.  Flu vaccine given HMPX #1 given today with return visit in 4w scheduled.   RTC with me in 6  months - labs same day if needed. Will ask research team to check RPR at next lab draw.       Other Visit Diagnoses     Routine screening for STI (sexually transmitted infection)    -  Primary   Relevant Orders   Cytology (oral, anal, urethral) ancillary only   Need for smallpox immunization  Relevant Orders   Vaccinia,Smallpox Monkeypox vaccine(Jynneos)      Rexene Alberts, MSN, NP-C [facility] Pager: [phone]  05/03/2021

## 2021-05-04 LAB — CYTOLOGY, (ORAL, ANAL, URETHRAL) ANCILLARY ONLY
Chlamydia: NEGATIVE
Comment: NEGATIVE
Comment: NORMAL
Neisseria Gonorrhea: NEGATIVE

## 2021-05-05 ENCOUNTER — Other Ambulatory Visit (HOSPITAL_COMMUNITY): Payer: Self-pay

## 2021-05-31 ENCOUNTER — Ambulatory Visit: Payer: BC Managed Care – PPO

## 2021-06-02 ENCOUNTER — Other Ambulatory Visit (HOSPITAL_COMMUNITY): Payer: Self-pay

## 2021-06-02 MED FILL — Emtricitabine-Tenofovir Alafenamide Fumarate Tab 200-25 MG: ORAL | 30 days supply | Qty: 30 | Fill #4 | Status: AC

## 2021-06-06 ENCOUNTER — Other Ambulatory Visit (HOSPITAL_COMMUNITY): Payer: Self-pay

## 2021-06-24 ENCOUNTER — Telehealth: Payer: Self-pay

## 2021-06-24 NOTE — Telephone Encounter (Signed)
Outbound call to patient, no answer. Left HIPPA compliant voicemail to return call. Valarie Cones

## 2021-06-27 ENCOUNTER — Encounter: Payer: BC Managed Care – PPO | Admitting: *Deleted

## 2021-06-28 ENCOUNTER — Other Ambulatory Visit: Payer: Self-pay

## 2021-06-28 ENCOUNTER — Ambulatory Visit (INDEPENDENT_AMBULATORY_CARE_PROVIDER_SITE_OTHER): Payer: BC Managed Care – PPO

## 2021-06-28 ENCOUNTER — Encounter (INDEPENDENT_AMBULATORY_CARE_PROVIDER_SITE_OTHER): Payer: BC Managed Care – PPO | Admitting: *Deleted

## 2021-06-28 VITALS — BP 134/78 | HR 75 | Temp 98.0°F | Wt 212.3 lb

## 2021-06-28 DIAGNOSIS — Z006 Encounter for examination for normal comparison and control in clinical research program: Secondary | ICD-10-CM

## 2021-06-28 DIAGNOSIS — Z23 Encounter for immunization: Secondary | ICD-10-CM | POA: Diagnosis not present

## 2021-06-28 NOTE — Research (Signed)
Savva here today for his Week 36 visit for A5391. States that he has been very adherent with his medication. He did forget to return his doravirine and will bring it all back at his next visit. No new complaints. He will return in February for his next study visit.

## 2021-06-29 ENCOUNTER — Other Ambulatory Visit (HOSPITAL_COMMUNITY): Payer: Self-pay

## 2021-07-04 ENCOUNTER — Other Ambulatory Visit (HOSPITAL_COMMUNITY): Payer: Self-pay

## 2021-07-15 ENCOUNTER — Other Ambulatory Visit (HOSPITAL_COMMUNITY): Payer: Self-pay

## 2021-08-16 ENCOUNTER — Other Ambulatory Visit (HOSPITAL_COMMUNITY): Payer: Self-pay

## 2021-08-16 MED FILL — Emtricitabine-Tenofovir Alafenamide Fumarate Tab 200-25 MG: ORAL | 30 days supply | Qty: 30 | Fill #5 | Status: CN

## 2021-08-19 ENCOUNTER — Other Ambulatory Visit (HOSPITAL_COMMUNITY): Payer: Self-pay

## 2021-08-19 MED FILL — Emtricitabine-Tenofovir Alafenamide Fumarate Tab 200-25 MG: ORAL | 30 days supply | Qty: 30 | Fill #5 | Status: CN

## 2021-08-22 ENCOUNTER — Other Ambulatory Visit (HOSPITAL_COMMUNITY): Payer: Self-pay

## 2021-08-22 MED FILL — Emtricitabine-Tenofovir Alafenamide Fumarate Tab 200-25 MG: ORAL | 30 days supply | Qty: 30 | Fill #5 | Status: CN

## 2021-08-24 ENCOUNTER — Other Ambulatory Visit (HOSPITAL_COMMUNITY): Payer: Self-pay

## 2021-08-24 MED FILL — Emtricitabine-Tenofovir Alafenamide Fumarate Tab 200-25 MG: ORAL | 30 days supply | Qty: 30 | Fill #5 | Status: AC

## 2021-08-24 MED FILL — Emtricitabine-Tenofovir Alafenamide Fumarate Tab 200-25 MG: ORAL | 30 days supply | Qty: 30 | Fill #5 | Status: CN

## 2021-09-15 ENCOUNTER — Other Ambulatory Visit (HOSPITAL_COMMUNITY): Payer: Self-pay

## 2021-09-20 ENCOUNTER — Other Ambulatory Visit (HOSPITAL_COMMUNITY): Payer: Self-pay

## 2021-09-20 ENCOUNTER — Encounter (INDEPENDENT_AMBULATORY_CARE_PROVIDER_SITE_OTHER): Payer: BC Managed Care – PPO | Admitting: *Deleted

## 2021-09-20 ENCOUNTER — Other Ambulatory Visit: Payer: Self-pay | Admitting: Physician Assistant

## 2021-09-20 ENCOUNTER — Other Ambulatory Visit: Payer: Self-pay

## 2021-09-20 ENCOUNTER — Other Ambulatory Visit: Payer: Self-pay | Admitting: *Deleted

## 2021-09-20 VITALS — BP 122/81 | HR 77 | Temp 98.4°F | Wt 205.1 lb

## 2021-09-20 DIAGNOSIS — Z21 Asymptomatic human immunodeficiency virus [HIV] infection status: Secondary | ICD-10-CM

## 2021-09-20 DIAGNOSIS — Z006 Encounter for examination for normal comparison and control in clinical research program: Secondary | ICD-10-CM

## 2021-09-20 LAB — BILIRUBIN, DIRECT: Bilirubin, Direct: 0.1 mg/dL (ref 0.0–0.2)

## 2021-09-20 LAB — LIPID PANEL
Cholesterol: 141 mg/dL (ref <200)
HDL: 34 mg/dL — ABNORMAL LOW (ref >=40)
LDL Cholesterol (Calc): 85 mg/dL (calc)
Non-HDL Cholesterol (Calc): 107 mg/dL (calc) (ref <130)
Total CHOL/HDL Ratio: 4.1 (calc) (ref <5.0)
Triglycerides: 128 mg/dL (ref <150)

## 2021-09-20 LAB — COMPREHENSIVE METABOLIC PANEL
AG Ratio: 0.9 (calc) — ABNORMAL LOW (ref 1.0–2.5)
ALT: 84 U/L — ABNORMAL HIGH (ref 9–46)
AST: 41 U/L — ABNORMAL HIGH (ref 10–40)
Albumin: 3.9 g/dL (ref 3.6–5.1)
Alkaline phosphatase (APISO): 114 U/L (ref 36–130)
BUN: 10 mg/dL (ref 7–25)
CO2: 26 mmol/L (ref 20–32)
Calcium: 9.4 mg/dL (ref 8.6–10.3)
Chloride: 103 mmol/L (ref 98–110)
Creat: 0.98 mg/dL (ref 0.60–1.24)
Globulin: 4.3 g/dL (calc) — ABNORMAL HIGH (ref 1.9–3.7)
Glucose, Bld: 94 mg/dL (ref 65–99)
Potassium: 4.2 mmol/L (ref 3.5–5.3)
Sodium: 136 mmol/L (ref 135–146)
Total Bilirubin: 0.6 mg/dL (ref 0.2–1.2)
Total Protein: 8.2 g/dL — ABNORMAL HIGH (ref 6.1–8.1)

## 2021-09-20 LAB — PHOSPHORUS: Phosphorus: 4 mg/dL (ref 2.5–4.5)

## 2021-09-20 MED ORDER — BICTEGRAVIR-EMTRICITAB-TENOFOV 50-200-25 MG PO TABS
ORAL_TABLET | ORAL | 0 refills | Status: DC
  Filled 2021-09-20: qty 30, 30d supply, fill #0

## 2021-09-20 MED ORDER — BIKTARVY 50-200-25 MG PO TABS
1.0000 | ORAL_TABLET | Freq: Every day | ORAL | 5 refills | Status: DC
  Filled 2021-09-20 – 2021-10-18 (×2): qty 30, 30d supply, fill #0
  Filled 2021-11-08: qty 30, 30d supply, fill #1

## 2021-09-20 NOTE — Research (Signed)
Jackson [name] completed his [phone] study today. He would like to start back on the [place], so we will get that ordered. He denies any new problems or medications at this time. He will have a DEXA scan done at [facility] today as part of the study.

## 2021-09-21 ENCOUNTER — Other Ambulatory Visit (HOSPITAL_COMMUNITY): Payer: Self-pay

## 2021-09-22 ENCOUNTER — Other Ambulatory Visit (HOSPITAL_COMMUNITY): Payer: Self-pay

## 2021-09-23 ENCOUNTER — Other Ambulatory Visit (HOSPITAL_COMMUNITY): Payer: Self-pay

## 2021-09-23 LAB — URINALYSIS, ROUTINE W REFLEX MICROSCOPIC

## 2021-09-29 ENCOUNTER — Encounter: Payer: Self-pay | Admitting: *Deleted

## 2021-09-29 LAB — CD4/CD8 (T-HELPER/T-SUPPRESSOR CELL)
CD4 % Helper T Cell: 34.1
CD4 Count: 512
CD8 % Suppressor T Cell: 37.4
CD8 T Cell Abs: 561

## 2021-10-04 ENCOUNTER — Other Ambulatory Visit (HOSPITAL_COMMUNITY): Payer: Self-pay

## 2021-10-05 ENCOUNTER — Other Ambulatory Visit (HOSPITAL_COMMUNITY): Payer: Self-pay

## 2021-10-12 ENCOUNTER — Encounter: Payer: Self-pay | Admitting: *Deleted

## 2021-10-12 LAB — HIV-1 RNA QUANT-NO REFLEX-BLD: HIV 1 RNA Quant: <40

## 2021-10-18 ENCOUNTER — Other Ambulatory Visit (HOSPITAL_COMMUNITY): Payer: Self-pay

## 2021-11-03 ENCOUNTER — Other Ambulatory Visit (HOSPITAL_COMMUNITY): Payer: Self-pay

## 2021-11-08 ENCOUNTER — Other Ambulatory Visit (HOSPITAL_COMMUNITY): Payer: Self-pay

## 2021-11-15 ENCOUNTER — Ambulatory Visit: Payer: BC Managed Care – PPO | Admitting: Infectious Diseases

## 2021-11-16 ENCOUNTER — Other Ambulatory Visit (HOSPITAL_COMMUNITY): Payer: Self-pay

## 2021-11-22 ENCOUNTER — Other Ambulatory Visit: Payer: Self-pay

## 2021-11-22 ENCOUNTER — Ambulatory Visit (INDEPENDENT_AMBULATORY_CARE_PROVIDER_SITE_OTHER): Payer: 59 | Admitting: Infectious Diseases

## 2021-11-22 ENCOUNTER — Other Ambulatory Visit (HOSPITAL_COMMUNITY): Payer: Self-pay

## 2021-11-22 ENCOUNTER — Encounter: Payer: Self-pay | Admitting: Infectious Diseases

## 2021-11-22 VITALS — BP 115/76 | HR 65 | Temp 98.7°F | Resp 16 | Ht 68.0 in | Wt 204.0 lb

## 2021-11-22 DIAGNOSIS — Z21 Asymptomatic human immunodeficiency virus [HIV] infection status: Secondary | ICD-10-CM | POA: Diagnosis not present

## 2021-11-22 DIAGNOSIS — J4520 Mild intermittent asthma, uncomplicated: Secondary | ICD-10-CM

## 2021-11-22 DIAGNOSIS — Z8619 Personal history of other infectious and parasitic diseases: Secondary | ICD-10-CM | POA: Insufficient documentation

## 2021-11-22 DIAGNOSIS — Z23 Encounter for immunization: Secondary | ICD-10-CM

## 2021-11-22 DIAGNOSIS — J301 Allergic rhinitis due to pollen: Secondary | ICD-10-CM | POA: Diagnosis not present

## 2021-11-22 MED ORDER — BIKTARVY 50-200-25 MG PO TABS
1.0000 | ORAL_TABLET | Freq: Every day | ORAL | 11 refills | Status: AC
  Filled 2021-11-22 – 2022-01-04 (×2): qty 30, 30d supply, fill #0
  Filled 2022-02-22: qty 30, 30d supply, fill #1
  Filled 2022-03-20: qty 30, 30d supply, fill #2
  Filled 2022-04-11: qty 30, 30d supply, fill #3
  Filled 2022-05-15: qty 30, 30d supply, fill #4
  Filled 2022-06-06: qty 30, 30d supply, fill #5
  Filled 2022-07-11: qty 30, 30d supply, fill #6
  Filled 2022-08-09: qty 30, 30d supply, fill #7
  Filled 2022-09-01: qty 30, 30d supply, fill #8

## 2021-11-22 MED ORDER — ALBUTEROL SULFATE HFA 108 (90 BASE) MCG/ACT IN AERS
2.0000 | INHALATION_SPRAY | RESPIRATORY_TRACT | 3 refills | Status: DC | PRN
  Filled 2021-11-22: qty 8.5, 18d supply, fill #0

## 2021-11-22 NOTE — Assessment & Plan Note (Signed)
Resume OTC allergy options with zyrtec + flonase  ?

## 2021-11-22 NOTE — Assessment & Plan Note (Signed)
Completing treatment with doxycycline now. Will call health department to get titer information so we can monitor him going forward. Next titer in 4 months at return visit.  ?

## 2021-11-22 NOTE — Patient Instructions (Addendum)
Nice to see you! ? ?Please continue your biktarvy everyday. I sent in more refills for you to [facility] so they can mail it.  ? ?I refilled your inhalers too.  ? ?Start back flonase and zyrtec for allergy treatments if you find that allergies are are causing problems.  ? ?Will plan to see you back in August for a check up.  ?

## 2021-11-22 NOTE — Progress Notes (Signed)
? ?Name: Jackson [name]  ?DOB: 06-07-2001  ?MRN: [id number] ?Patient, No Pcp Per (Inactive)  ? ? ?Brief Narrative:  ?Jackson [name] is a 21 y.o. AA male with HIV infection. Originally diagnosed in 07/19/2017 after seeking STI testing. CD4 nadir 570. History of OIs: none. HIV Risk: MSM.  ? ?Previous Regimens:  ?Biktarvy 2019 ?Pifeltro + Descovy 2022 (ACTG Trial)  ?Biktarvy 2023 ? ?Genotype:  ?07/2017 - no mutations  ? ? ? ?SUBJECTIVE ? ?Chief Complaint  ?Patient presents with  ? Follow-up  ?  B20 - patient currently taking doxycycline 100 mg BID for syphilis treatment - prescribed by health dept   ?  ? ? ?HPI:  ?Doing well and resumed Biktarvy once a day after completing ACTG trial for Pifeltro+Descovy for weight gain. He did not notice much difference and chose to go back to biktarvy due to that. Has been taking it everyday without problem and gets it mailed to his house.  ? ?Taking doxycycline now for syphilis contact - allergic to amoxicillin. They gave him 4 weeks.  ?Had more of a dry peeling rash on the feet and hands that has gone away. Nearly done with treatment.  ? ?Ready to finish school and start looking for work.  ? ?Spent time discussing vitamins / supplements for hair growth.  ? ? ? ?Review of Systems  ?Constitutional:  Negative for chills and fever.  ?HENT:  Negative for sore throat.   ?Eyes:  Negative for visual disturbance.  ?Gastrointestinal:  Negative for abdominal pain, anal bleeding and rectal pain.  ?Genitourinary:  Negative for dysuria, genital sores, penile discharge, penile pain, scrotal swelling and testicular pain.  ?Musculoskeletal:  Negative for arthralgias and joint swelling.  ?Skin:  Negative for rash.  ?Neurological:  Negative for headaches.  ?Hematological:  Negative for adenopathy.  ? ? ?  ? ?Past Medical History:  ?Diagnosis Date  ? Asthma   ? HIV infection (HCC)   ? ? ?Outpatient Medications Prior to Visit  ?Medication Sig Dispense Refill  ? cetirizine (ZYRTEC) 10 MG tablet Take 10 mg by  mouth daily.    ? doxycycline (VIBRAMYCIN) 100 MG capsule Take 100 mg by mouth 2 (two) times daily.    ? fluticasone (FLOVENT HFA) 44 MCG/ACT inhaler Inhale 2 puffs into the lungs 2 (two) times daily. For 1-2 weeks or until breathing returns to baseline. 1 Inhaler 3  ? ipratropium (ATROVENT) 0.03 % nasal spray Place 2 sprays into both nostrils 2 (two) times daily. As needed 30 mL 12  ? albuterol (PROVENTIL HFA;VENTOLIN HFA) 108 (90 Base) MCG/ACT inhaler Inhale 2 puffs into the lungs every 4 (four) hours as needed for wheezing or shortness of breath. 1 Inhaler 3  ? bictegravir-emtricitabine-tenofovir AF (BIKTARVY) 50-200-25 MG TABS tablet Take 1 tablet by mouth daily. 30 tablet 5  ? ?No facility-administered medications prior to visit.  ? ?Allergies  ?Allergen Reactions  ? Amoxicillin Hives  ? ? ?Social History  ? ?Tobacco Use  ? Smoking status: Never  ? Smokeless tobacco: Never  ?Vaping Use  ? Vaping Use: Never used  ?Substance Use Topics  ? Alcohol use: Yes  ?  Comment: OCC  ? Drug use: Yes  ?  Frequency: 7.0 times per week  ?  Types: Marijuana  ? ? ?Family History  ?Problem Relation Age of Onset  ? Healthy Mother   ? ? ?Social History  ? ?Substance and Sexual Activity  ?Sexual Activity Not Currently  ? Partners: Female, Male  ? Birth control/protection:  Condom  ? Comment: accepted condoms  ? ? ?OBJECTIVE: ?Vitals:  ? 11/22/21 1026  ?BP: 115/76  ?Pulse: 65  ?Resp: 16  ?Temp: 98.7 ?F (37.1 ?C)  ?TempSrc: Oral  ?SpO2: 97%  ?Weight: 204 lb (92.5 kg)  ?Height: 5\' 8"  (1.727 m)  ? ?Body mass index is 31.02 kg/m?. ? ?Physical Exam ?Constitutional:   ?   Appearance: Normal appearance. He is not ill-appearing.  ?HENT:  ?   Head: Normocephalic.  ?   Mouth/Throat:  ?   Mouth: Mucous membranes are moist.  ?   Pharynx: Oropharynx is clear.  ?Eyes:  ?   General: No scleral icterus. ?Pulmonary:  ?   Effort: Pulmonary effort is normal.  ?Musculoskeletal:     ?   General: Normal range of motion.  ?   Cervical back: Normal range of  motion.  ?Skin: ?   Coloration: Skin is not jaundiced or pale.  ?Neurological:  ?   Mental Status: He is alert and oriented to person, place, and time.  ?Psychiatric:     ?   Mood and Affect: Mood normal.     ?   Judgment: Judgment normal.  ? ? ? ?Lab Results ?Lab Results  ?Component Value Date  ? WBC 7.0 10/25/2020  ? HGB 15.7 10/25/2020  ? HCT 46 10/25/2020  ? MCV 87.4 09/20/2020  ? PLT 253 10/25/2020  ?  ?Lab Results  ?Component Value Date  ? CREATININE 0.98 09/20/2021  ? BUN 10 09/20/2021  ? NA 136 09/20/2021  ? K 4.2 09/20/2021  ? CL 103 09/20/2021  ? CO2 26 09/20/2021  ?  ?Lab Results  ?Component Value Date  ? ALT 84 (H) 09/20/2021  ? AST 41 (H) 09/20/2021  ? BILITOT 0.6 09/20/2021  ?  ?Lab Results  ?Component Value Date  ? CHOL 141 09/20/2021  ? HDL 34 (L) 09/20/2021  ? LDLCALC 85 09/20/2021  ? TRIG 128 09/20/2021  ? CHOLHDL 4.1 09/20/2021  ? ?HIV 1 RNA Quant  ?Date Value  ?09/20/2021 <40  ?04/12/2021 <40  ?09/29/2020 <20 Copies/mL  ? ?CD4 T Cell Abs  ?Date Value  ?10/22/2020 764  ?09/20/2020 944 /uL  ?03/09/2020 792 /uL  ? ? ?ASSESSMENT & PLAN: ? ?Patient Active Problem List  ? Diagnosis Date Noted  ? HIV (human immunodeficiency virus infection) (HCC) 08/20/2017  ? History of secondary syphilis of skin 11/22/2021  ? Screen for STD (sexually transmitted disease) 10/14/2018  ? Asthma in adult 08/23/2017  ? Seasonal allergic rhinitis due to pollen 08/23/2017  ? Healthcare maintenance 08/20/2017  ? Elevated AST (SGOT) 08/20/2017  ? ? ?Problem List Items Addressed This Visit   ? ?  ? High  ? HIV (human immunodeficiency virus infection) (HCC) (Chronic)  ?  Well controlled on biktarvy once daily. Labs in February with undetectable viral load and healthy CD4. He did not notice much improvement regarding weight with the Pifeltro and Descovy.  ?Syphilis infection recently--finishing up doxycycline for this (4 weeks).  ? ?Update vaccines with TDap and Menveo booster today.  ? ?RTC in August for routine care and repeat  RPR.  ?  ?  ? Relevant Medications  ? bictegravir-emtricitabine-tenofovir AF (BIKTARVY) 50-200-25 MG TABS tablet  ?  ? Unprioritized  ? Asthma in adult  ?  Refills sent in for inhaler.  ?  ?  ? Relevant Medications  ? albuterol (VENTOLIN HFA) 108 (90 Base) MCG/ACT inhaler  ? History of secondary syphilis of skin  ?  Completing  treatment with doxycycline now. Will call health department to get titer information so we can monitor him going forward. Next titer in 4 months at return visit.  ?  ?  ? Seasonal allergic rhinitis due to pollen  ?  Resume OTC allergy options with zyrtec + flonase  ?  ?  ? ?Rexene Alberts, MSN, NP-C ?[facility] ?[facility] ?Pager: [phone] ? ?11/22/2021  ? ?

## 2021-11-22 NOTE — Assessment & Plan Note (Signed)
Refills sent in for inhaler.  ?

## 2021-11-22 NOTE — Assessment & Plan Note (Signed)
Well controlled on biktarvy once daily. Labs in February with undetectable viral load and healthy CD4. He did not notice much improvement regarding weight with the Pifeltro and Descovy.  ?Syphilis infection recently--finishing up doxycycline for this (4 weeks).  ? ?Update vaccines with TDap and Menveo booster today.  ? ?RTC in August for routine care and repeat RPR.  ?

## 2021-11-22 NOTE — Addendum Note (Signed)
Addended by: Tomi Bamberger on: 11/22/2021 11:06 AM ? ? Modules accepted: Orders ? ?

## 2021-11-30 ENCOUNTER — Other Ambulatory Visit (HOSPITAL_COMMUNITY): Payer: Self-pay

## 2021-12-07 ENCOUNTER — Other Ambulatory Visit (HOSPITAL_COMMUNITY): Payer: Self-pay

## 2021-12-09 ENCOUNTER — Other Ambulatory Visit (HOSPITAL_COMMUNITY): Payer: Self-pay

## 2021-12-14 ENCOUNTER — Other Ambulatory Visit (HOSPITAL_COMMUNITY): Payer: Self-pay

## 2022-01-04 ENCOUNTER — Other Ambulatory Visit (HOSPITAL_COMMUNITY): Payer: Self-pay

## 2022-01-26 ENCOUNTER — Other Ambulatory Visit (HOSPITAL_COMMUNITY): Payer: Self-pay

## 2022-02-01 ENCOUNTER — Other Ambulatory Visit (HOSPITAL_COMMUNITY): Payer: Self-pay

## 2022-02-09 ENCOUNTER — Other Ambulatory Visit (HOSPITAL_COMMUNITY): Payer: Self-pay

## 2022-02-22 ENCOUNTER — Other Ambulatory Visit (HOSPITAL_COMMUNITY): Payer: Self-pay

## 2022-03-20 ENCOUNTER — Other Ambulatory Visit (HOSPITAL_COMMUNITY): Payer: Self-pay

## 2022-04-03 ENCOUNTER — Other Ambulatory Visit: Payer: Self-pay

## 2022-04-03 ENCOUNTER — Encounter: Payer: Self-pay | Admitting: Infectious Diseases

## 2022-04-03 ENCOUNTER — Other Ambulatory Visit (HOSPITAL_COMMUNITY)
Admission: RE | Admit: 2022-04-03 | Discharge: 2022-04-03 | Disposition: A | Discharge status: Home / Self Care | Payer: 59 | Source: Ambulatory Visit | Attending: Infectious Diseases | Admitting: Infectious Diseases

## 2022-04-03 ENCOUNTER — Ambulatory Visit (INDEPENDENT_AMBULATORY_CARE_PROVIDER_SITE_OTHER): Payer: 59 | Admitting: Infectious Diseases

## 2022-04-03 VITALS — BP 117/77 | HR 96 | Temp 98.3°F | Wt 207.0 lb

## 2022-04-03 DIAGNOSIS — Z113 Encounter for screening for infections with a predominantly sexual mode of transmission: Secondary | ICD-10-CM | POA: Diagnosis present

## 2022-04-03 DIAGNOSIS — Z8619 Personal history of other infectious and parasitic diseases: Secondary | ICD-10-CM | POA: Diagnosis not present

## 2022-04-03 DIAGNOSIS — R7401 Elevation of levels of liver transaminase levels: Secondary | ICD-10-CM | POA: Diagnosis not present

## 2022-04-03 DIAGNOSIS — Z21 Asymptomatic human immunodeficiency virus [HIV] infection status: Secondary | ICD-10-CM

## 2022-04-03 NOTE — Progress Notes (Unsigned)
Name: Jackson [name]  DOB: 04-26-2001  MRN: [id number] Patient, No Pcp Per    Brief Narrative:  Norwood is a 21 y.o. AA male with HIV infection. Originally diagnosed in 07/19/2017 after seeking STI testing. CD4 nadir 570. History of OIs: none. HIV Risk: MSM.   Previous Regimens:  Biktarvy 2019 Pifeltro + Descovy 2022 (ACTG Trial)  Biktarvy 2023  Genotype:  07/2017 - no mutations     SUBJECTIVE  Chief Complaint  Patient presents with   Follow-up      HPI:  Doing well and resumed Biktarvy once a day after completing ACTG trial for Pifeltro+Descovy for weight gain. He did not notice much difference and chose to go back to biktarvy due to that. Has been taking it everyday without problem and gets it mailed to his house.   Working on finding a job after graduating from [employer].  Requests sexual health screening and questions about    Review of Systems  Constitutional:  Negative for chills and fever.  HENT:  Negative for sore throat.   Eyes:  Negative for visual disturbance.  Gastrointestinal:  Negative for abdominal pain, anal bleeding and rectal pain.  Genitourinary:  Negative for dysuria, genital sores, penile discharge, penile pain, scrotal swelling and testicular pain.  Musculoskeletal:  Negative for arthralgias and joint swelling.  Skin:  Negative for rash.  Neurological:  Negative for headaches.  Hematological:  Negative for adenopathy.        Past Medical History:  Diagnosis Date   Asthma    HIV infection (HCC)     Outpatient Medications Prior to Visit  Medication Sig Dispense Refill   albuterol (VENTOLIN HFA) 108 (90 Base) MCG/ACT inhaler Inhale 2 puffs into the lungs every 4 (four) hours as needed for wheezing or shortness of breath. 9 g 3   bictegravir-emtricitabine-tenofovir AF (BIKTARVY) 50-200-25 MG TABS tablet Take 1 tablet by mouth daily. 30 tablet 11   cetirizine (ZYRTEC) 10 MG tablet Take 10 mg by mouth daily.     fluticasone (FLOVENT HFA)  44 MCG/ACT inhaler Inhale 2 puffs into the lungs 2 (two) times daily. For 1-2 weeks or until breathing returns to baseline. 1 Inhaler 3   ipratropium (ATROVENT) 0.03 % nasal spray Place 2 sprays into both nostrils 2 (two) times daily. As needed 30 mL 12   doxycycline (VIBRAMYCIN) 100 MG capsule Take 100 mg by mouth 2 (two) times daily. (Patient not taking: Reported on 04/03/2022)     No facility-administered medications prior to visit.   Allergies  Allergen Reactions   Amoxicillin Hives    Social History   Tobacco Use   Smoking status: Never   Smokeless tobacco: Never  Vaping Use   Vaping Use: Never used  Substance Use Topics   Alcohol use: Yes    Comment: OCC   Drug use: Yes    Frequency: 7.0 times per week    Types: Marijuana    Family History  Problem Relation Age of Onset   Healthy Mother     Social History   Substance and Sexual Activity  Sexual Activity Not Currently   Partners: Female, Male   Birth control/protection: Condom   Comment: accepted condoms 8/21    OBJECTIVE: Vitals:   04/03/22 1533  BP: 117/77  Pulse: 96  Temp: 98.3 F (36.8 C)  TempSrc: Oral  SpO2: 95%  Weight: 207 lb (93.9 kg)   Body mass index is 31.47 kg/m.  Physical Exam Constitutional:  Appearance: Normal appearance. He is not ill-appearing.  HENT:     Head: Normocephalic.     Mouth/Throat:     Mouth: Mucous membranes are moist.     Pharynx: Oropharynx is clear.  Eyes:     General: No scleral icterus. Pulmonary:     Effort: Pulmonary effort is normal.  Musculoskeletal:        General: Normal range of motion.     Cervical back: Normal range of motion.  Skin:    Coloration: Skin is not jaundiced or pale.  Neurological:     Mental Status: He is alert and oriented to person, place, and time.  Psychiatric:        Mood and Affect: Mood normal.        Judgment: Judgment normal.      Lab Results Lab Results  Component Value Date   WBC 7.0 10/25/2020   HGB 15.7  10/25/2020   HCT 46 10/25/2020   MCV 87.4 09/20/2020   PLT 253 10/25/2020    Lab Results  Component Value Date   CREATININE 0.98 09/20/2021   BUN 10 09/20/2021   NA 136 09/20/2021   K 4.2 09/20/2021   CL 103 09/20/2021   CO2 26 09/20/2021    Lab Results  Component Value Date   ALT 84 (H) 09/20/2021   AST 41 (H) 09/20/2021   BILITOT 0.6 09/20/2021    Lab Results  Component Value Date   CHOL 141 09/20/2021   HDL 34 (L) 09/20/2021   LDLCALC 85 09/20/2021   TRIG 128 09/20/2021   CHOLHDL 4.1 09/20/2021   HIV 1 RNA Quant  Date Value  09/20/2021 <40  04/12/2021 <40  09/29/2020 <20 Copies/mL   CD4 T Cell Abs  Date Value  10/22/2020 764  09/20/2020 944 /uL  03/09/2020 792 /uL    ASSESSMENT & PLAN:  Patient Active Problem List   Diagnosis Date Noted   HIV (human immunodeficiency virus infection) (HCC) 08/20/2017   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

## 2022-04-03 NOTE — Patient Instructions (Addendum)
[employer] - [employer] about nutrition and fitness. I bet you would love this.   Creatine - 5 gm a day. Start 2-3 days a week to avoid GI stuff. Can work up to daily.   Vitamin D supplement is something to think of adding. 1,000  - 5,000 units once daily.   [facility]:  [email or web address]   Will see you back in 4 months! Would recommend flu shot after they are available sometime September / October.

## 2022-04-04 LAB — T-HELPER CELLS (CD4) COUNT (NOT AT ARMC)
CD4 % Helper T Cell: 33 % (ref 33–65)
CD4 T Cell Abs: 844 /uL (ref 400–1790)

## 2022-04-04 NOTE — Assessment & Plan Note (Signed)
Repeat RPR today to follow after course of doxycycline for early syphilis infection.

## 2022-04-04 NOTE — Assessment & Plan Note (Addendum)
Have fluctuated in the past - will repeat at next OV and re-screen Hep C Ab.

## 2022-04-04 NOTE — Assessment & Plan Note (Signed)
Very well controlled on once daily Biktarvy. No concerns with access or adherence to medication. They are tolerating the medication well without side effects. No drug interactions identified. Pertinent lab tests ordered today.  No changes to insurance coverage.  No dental needs today.  No concern over anxious/depressed mood.  Sexual health and family planning discussed - we had a good discussion about having children. He is interested in surrogacy options - information provided. .  Vaccines updated today - see health maintenance section.    Return in about 4 months (around 08/03/2022).

## 2022-04-05 LAB — URINE CYTOLOGY ANCILLARY ONLY
Chlamydia: NEGATIVE
Comment: NEGATIVE
Comment: NEGATIVE
Comment: NORMAL
Neisseria Gonorrhea: NEGATIVE
Trichomonas: NEGATIVE

## 2022-04-05 LAB — CYTOLOGY, (ORAL, ANAL, URETHRAL) ANCILLARY ONLY
Chlamydia: NEGATIVE
Comment: NEGATIVE
Comment: NORMAL
Neisseria Gonorrhea: NEGATIVE

## 2022-04-06 LAB — HIV-1 RNA QUANT-NO REFLEX-BLD
HIV 1 RNA Quant: NOT DETECTED Copies/mL
HIV-1 RNA Quant, Log: NOT DETECTED Log cps/mL

## 2022-04-06 LAB — RPR TITER: RPR Titer: 1:8 {titer} — ABNORMAL HIGH

## 2022-04-06 LAB — FLUORESCENT TREPONEMAL AB(FTA)-IGG-BLD: Fluorescent Treponemal ABS: REACTIVE — AB

## 2022-04-06 LAB — RPR: RPR Ser Ql: REACTIVE — AB

## 2022-04-11 ENCOUNTER — Other Ambulatory Visit (HOSPITAL_COMMUNITY): Payer: Self-pay

## 2022-04-20 ENCOUNTER — Other Ambulatory Visit (HOSPITAL_COMMUNITY): Payer: Self-pay

## 2022-05-11 ENCOUNTER — Other Ambulatory Visit (HOSPITAL_COMMUNITY): Payer: Self-pay

## 2022-05-15 ENCOUNTER — Other Ambulatory Visit (HOSPITAL_COMMUNITY): Payer: Self-pay

## 2022-05-18 ENCOUNTER — Other Ambulatory Visit (HOSPITAL_COMMUNITY): Payer: Self-pay

## 2022-06-06 ENCOUNTER — Other Ambulatory Visit (HOSPITAL_COMMUNITY): Payer: Self-pay

## 2022-06-14 ENCOUNTER — Other Ambulatory Visit (HOSPITAL_COMMUNITY): Payer: Self-pay

## 2022-07-07 ENCOUNTER — Other Ambulatory Visit (HOSPITAL_COMMUNITY): Payer: Self-pay

## 2022-07-11 ENCOUNTER — Other Ambulatory Visit (HOSPITAL_COMMUNITY): Payer: Self-pay

## 2022-07-13 ENCOUNTER — Other Ambulatory Visit (HOSPITAL_COMMUNITY): Payer: Self-pay

## 2022-08-03 ENCOUNTER — Other Ambulatory Visit (HOSPITAL_COMMUNITY): Payer: Self-pay

## 2022-08-03 ENCOUNTER — Ambulatory Visit: Payer: 59 | Admitting: Infectious Diseases

## 2022-08-08 ENCOUNTER — Other Ambulatory Visit: Payer: Self-pay

## 2022-08-09 ENCOUNTER — Other Ambulatory Visit (HOSPITAL_COMMUNITY): Payer: Self-pay

## 2022-08-09 ENCOUNTER — Other Ambulatory Visit: Payer: Self-pay

## 2022-08-10 ENCOUNTER — Other Ambulatory Visit (HOSPITAL_COMMUNITY): Payer: Self-pay

## 2022-08-25 ENCOUNTER — Ambulatory Visit: Payer: BC Managed Care – PPO | Admitting: Infectious Diseases

## 2022-09-01 ENCOUNTER — Other Ambulatory Visit (HOSPITAL_COMMUNITY): Payer: Self-pay

## 2022-09-06 ENCOUNTER — Other Ambulatory Visit: Payer: Self-pay

## 2022-09-07 ENCOUNTER — Other Ambulatory Visit (HOSPITAL_COMMUNITY): Payer: Self-pay

## 2022-09-08 ENCOUNTER — Other Ambulatory Visit: Payer: Self-pay

## 2022-09-25 ENCOUNTER — Telehealth: Payer: Self-pay

## 2022-09-25 ENCOUNTER — Ambulatory Visit: Payer: BC Managed Care – PPO | Admitting: Infectious Diseases

## 2022-09-25 NOTE — Telephone Encounter (Signed)
Pt called and left VM to cancel/resched appt for this morning. CMA attempted to call when past appt, and I attempted to call, but no answer. Will try to reach out via MyChart to have pt resched appt.

## 2022-09-28 ENCOUNTER — Other Ambulatory Visit (HOSPITAL_COMMUNITY): Payer: Self-pay

## 2022-10-02 ENCOUNTER — Other Ambulatory Visit (HOSPITAL_COMMUNITY): Payer: Self-pay

## 2022-10-04 ENCOUNTER — Other Ambulatory Visit (HOSPITAL_COMMUNITY): Payer: Self-pay

## 2023-02-02 ENCOUNTER — Other Ambulatory Visit: Payer: Self-pay

## 2023-03-12 ENCOUNTER — Encounter: Payer: Self-pay | Admitting: Infectious Diseases

## 2023-03-12 ENCOUNTER — Other Ambulatory Visit: Payer: Self-pay

## 2023-03-12 ENCOUNTER — Ambulatory Visit (INDEPENDENT_AMBULATORY_CARE_PROVIDER_SITE_OTHER): Payer: BC Managed Care – PPO | Admitting: Infectious Diseases

## 2023-03-12 ENCOUNTER — Other Ambulatory Visit (HOSPITAL_COMMUNITY)
Admission: RE | Admit: 2023-03-12 | Discharge: 2023-03-12 | Disposition: A | Discharge status: Home / Self Care | Payer: BC Managed Care – PPO | Source: Ambulatory Visit | Attending: Infectious Diseases | Admitting: Infectious Diseases

## 2023-03-12 VITALS — BP 137/83 | HR 111 | Temp 98.5°F | Wt 205.0 lb

## 2023-03-12 DIAGNOSIS — J301 Allergic rhinitis due to pollen: Secondary | ICD-10-CM

## 2023-03-12 DIAGNOSIS — B20 Human immunodeficiency virus [HIV] disease: Secondary | ICD-10-CM | POA: Diagnosis not present

## 2023-03-12 DIAGNOSIS — J4520 Mild intermittent asthma, uncomplicated: Secondary | ICD-10-CM

## 2023-03-12 DIAGNOSIS — Z113 Encounter for screening for infections with a predominantly sexual mode of transmission: Secondary | ICD-10-CM

## 2023-03-12 DIAGNOSIS — Z21 Asymptomatic human immunodeficiency virus [HIV] infection status: Secondary | ICD-10-CM

## 2023-03-12 DIAGNOSIS — Z8619 Personal history of other infectious and parasitic diseases: Secondary | ICD-10-CM

## 2023-03-12 DIAGNOSIS — Z0000 Encounter for general adult medical examination without abnormal findings: Secondary | ICD-10-CM

## 2023-03-12 MED ORDER — BICTEGRAVIR-EMTRICITAB-TENOFOV 50-200-25 MG PO TABS: 1.0000 | ORAL_TABLET | Freq: Every day | ORAL | 5 refills | Status: DC

## 2023-03-12 MED ORDER — ALBUTEROL SULFATE HFA 108 (90 BASE) MCG/ACT IN AERS: 2.0000 | INHALATION_SPRAY | RESPIRATORY_TRACT | 3 refills | Status: DC | PRN

## 2023-03-12 NOTE — Assessment & Plan Note (Signed)
Refill albuterol inhaler for PRN use. Discussed if he has > 2-3 days a week of use consistently would resume flovent.

## 2023-03-12 NOTE — Patient Instructions (Addendum)
Try to avoid the triggers is the best for heartburn.  Famotidine / Pepcid is the over the counter pill that OK but be careful with the chewables for heartburn.   Otherwise will let you know how your labs look on mychart portal.   We could consider switch to Delstrigo once daily if you wanted to see if that may be helpful but I don't think the results of the study were really that great to feel reassured that will help much. Keep doing what you have had some success with!

## 2023-03-12 NOTE — Assessment & Plan Note (Signed)
Flu/COVID boosters in the fall (October). Completed HPV vaccine series.  Asymptomatic sexual health screening collected.

## 2023-03-12 NOTE — Assessment & Plan Note (Signed)
-   Repeat RPR

## 2023-03-12 NOTE — Assessment & Plan Note (Signed)
Very well controlled on once daily biktarvy. No concerns with access or adherence to medication. They are tolerating the medication well without side effects. No drug interactions identified. Pertinent lab tests ordered today.  No changes to insurance coverage.  No dental needs today.  No concern over anxious/depressed mood.  Sexual health and family planning discussed - asymptomatic screening today.   I don't feel the DOIT trial was very successful to prove changing from integrase option helped people achieve any weight loss.  He will continue with biktarvy for now.   Return in about 6 months (around 09/12/2023).

## 2023-03-12 NOTE — Progress Notes (Signed)
Name: Jackson [name]  DOB: 03/17/2001  MRN: [id number] Patient, No Pcp Per     Brief Narrative:  Jackson [name] is a 22 y.o. AA male with HIV infection. Originally diagnosed in 07/19/2017 after seeking STI testing. CD4 nadir 570. History of OIs: none. HIV Risk: MSM.   Previous Regimens:  Biktarvy 2019 Pifeltro + Descovy 2022 (ACTG Trial)  Biktarvy 2023  Genotype:  07/2017 - no mutations     SUBJECTIVE  CC: Routine follow up care.     HPI:  Doing well on once daily biktarvy. No concerns today.  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  Would like  refill of albuterol inhaler with fall seasonal allergies coming up. Has not felt like he has needed flovent in some time.   Wondering about different ARVs for more weight favorable options. He has been able to achieve some weight loss but feels like his efforts are out of proportion to his loss.    Review of Systems  Constitutional:  Negative for chills and fever.  HENT:  Negative for sore throat.   Eyes:  Negative for visual disturbance.  Gastrointestinal:  Negative for abdominal pain, anal bleeding and rectal pain.  Genitourinary:  Negative for dysuria, genital sores, penile discharge, penile pain, scrotal swelling and testicular pain.  Musculoskeletal:  Negative for arthralgias and joint swelling.  Skin:  Negative for rash.  Neurological:  Negative for headaches.  Hematological:  Negative for adenopathy.      Past Medical History:  Diagnosis Date   Asthma    HIV infection (HCC)     Outpatient Medications Prior to Visit  Medication Sig Dispense Refill   cetirizine (ZYRTEC) 10 MG tablet Take 10 mg by mouth daily.     fluticasone (FLOVENT HFA) 44 MCG/ACT inhaler Inhale 2 puffs into the lungs 2 (two) times daily. For 1-2 weeks or until breathing returns to baseline. 1 Inhaler 3   ipratropium (ATROVENT) 0.03 % nasal spray Place 2 sprays into both nostrils 2 (two) times  daily. As needed 30 mL 12   albuterol (VENTOLIN HFA) 108 (90 Base) MCG/ACT inhaler Inhale 2 puffs into the lungs every 4 (four) hours as needed for wheezing or shortness of breath. 9 g 3   No facility-administered medications prior to visit.   Allergies  Allergen Reactions   Amoxicillin Hives    Social History   Tobacco Use   Smoking status: Never   Smokeless tobacco: Never  Vaping Use   Vaping status: Never Used  Substance Use Topics   Alcohol use: Yes    Comment: OCC   Drug use: Yes    Frequency: 7.0 times per week    Types: Marijuana    Family History  Problem Relation Age of Onset   Healthy Mother     Social History   Substance and Sexual Activity  Sexual Activity Not Currently   Partners: Female, Male   Birth control/protection: Condom   Comment: accepted condoms 8/21    OBJECTIVE: Vitals:   03/12/23 1607  BP: 137/83  Pulse: (!) 111  Temp: 98.5 F (36.9 C)  TempSrc: Oral  SpO2: 93%  Weight: 205 lb (93 kg)   Body mass index is 31.17 kg/m.  Physical Exam Constitutional:      Appearance: Normal appearance. He is not ill-appearing.  HENT:     Head: Normocephalic.     Mouth/Throat:     Mouth: Mucous membranes are moist.  Pharynx: Oropharynx is clear.  Eyes:     General: No scleral icterus. Pulmonary:     Effort: Pulmonary effort is normal.  Musculoskeletal:        General: Normal range of motion.     Cervical back: Normal range of motion.  Skin:    Coloration: Skin is not jaundiced or pale.  Neurological:     Mental Status: He is alert and oriented to person, place, and time.  Psychiatric:        Mood and Affect: Mood normal.        Judgment: Judgment normal.      Lab Results Lab Results  Component Value Date   WBC 7.0 10/25/2020   HGB 15.7 10/25/2020   HCT 46 10/25/2020   MCV 87.4 09/20/2020   PLT 253 10/25/2020    Lab Results  Component Value Date   CREATININE 0.98 09/20/2021   BUN 10 09/20/2021   NA 136 09/20/2021   K  4.2 09/20/2021   CL 103 09/20/2021   CO2 26 09/20/2021    Lab Results  Component Value Date   ALT 84 (H) 09/20/2021   AST 41 (H) 09/20/2021   BILITOT 0.6 09/20/2021    Lab Results  Component Value Date   CHOL 141 09/20/2021   HDL 34 (L) 09/20/2021   LDLCALC 85 09/20/2021   TRIG 128 09/20/2021   CHOLHDL 4.1 09/20/2021   HIV 1 RNA Quant  Date Value  04/03/2022 Not Detected Copies/mL  09/20/2021 <40  04/12/2021 <40   CD4 T Cell Abs  Date Value  04/03/2022 844 /uL  10/22/2020 764  09/20/2020 944 /uL    ASSESSMENT & PLAN:  Patient Active Problem List   Diagnosis Date Noted   HIV (human immunodeficiency virus infection) (HCC) 08/20/2017   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 No concerns with access or adherence to medication. They are tolerating the medication well without side effects. No drug interactions identified. Pertinent lab tests ordered today.  No changes to insurance coverage.  No dental needs today.  No concern over anxious/depressed mood.  Sexual health and family planning discussed - asymptomatic screening today.   I don't feel the DOIT trial was very successful to prove changing from integrase option helped people achieve any weight loss.  He will continue with biktarvy for now.   Return in about 6 months (around 09/12/2023).        Relevant Medications   bictegravir-emtricitabine-tenofovir AF (BIKTARVY) 50-200-25 MG TABS tablet   Other Relevant Orders   COMPLETE METABOLIC PANEL WITH GFR   Lipid panel     Unprioritized   Seasonal allergic rhinitis due to pollen   Relevant  Medications   albuterol (VENTOLIN HFA) 108 (90 Base) MCG/ACT inhaler   History of secondary syphilis of skin    Repeat RPR       Healthcare maintenance    Flu/COVID boosters in the fall (October). Completed HPV vaccine series.  Asymptomatic sexual health screening collected.       Asthma in adult    Refill albuterol inhaler for PRN use. Discussed if he has >  2-3 days a week of use consistently would resume flovent.       Relevant Medications   albuterol (VENTOLIN HFA) 108 (90 Base) MCG/ACT inhaler   Other Visit Diagnoses     Routine screening for STI (sexually transmitted infection)       Relevant Orders   Cytology (oral, anal, urethral) ancillary only   RPR   HIV 1 RNA quant-no reflex-bld   T-helper cells (CD4) count      Rexene Alberts, MSN, NP-C [facility] Pager: [phone]  03/12/2023

## 2023-03-20 ENCOUNTER — Encounter: Payer: Self-pay | Admitting: Infectious Diseases

## 2023-05-28 ENCOUNTER — Other Ambulatory Visit (HOSPITAL_COMMUNITY): Payer: Self-pay

## 2023-06-07 ENCOUNTER — Other Ambulatory Visit: Payer: Self-pay | Admitting: Pharmacist

## 2023-06-07 DIAGNOSIS — Z21 Asymptomatic human immunodeficiency virus [HIV] infection status: Secondary | ICD-10-CM

## 2023-06-07 MED ORDER — BICTEGRAVIR-EMTRICITAB-TENOFOV 50-200-25 MG PO TABS
1.0000 | ORAL_TABLET | Freq: Every day | ORAL | Status: DC
Start: 2023-06-01 — End: 2023-06-13

## 2023-06-07 NOTE — Progress Notes (Signed)
Medication Samples have been provided to the patient.  Drug name: Biktarvy        Strength: 50/200/25 mg       Qty: 14 tablets (2 bottles) LOT: [id number]   Exp.Date: 8/26  Dosing instructions: Take one tablet by mouth once daily  The patient has been instructed regarding the correct time, dose, and frequency of taking this medication, including desired effects and most common side effects.   Amanda Wolfe, PharmD, CPP, BCIDP, AAHIVP Clinical Pharmacist Practitioner Infectious Diseases Clinical Pharmacist [facility]  

## 2023-06-13 ENCOUNTER — Other Ambulatory Visit: Payer: Self-pay

## 2023-06-13 DIAGNOSIS — Z21 Asymptomatic human immunodeficiency virus [HIV] infection status: Secondary | ICD-10-CM

## 2023-06-13 MED ORDER — BICTEGRAVIR-EMTRICITAB-TENOFOV 50-200-25 MG PO TABS
1.0000 | ORAL_TABLET | Freq: Every day | ORAL | 4 refills | Status: DC
Start: 2023-06-13 — End: 2023-10-19

## 2023-09-18 ENCOUNTER — Ambulatory Visit: Payer: Self-pay | Admitting: Infectious Diseases

## 2023-10-19 ENCOUNTER — Ambulatory Visit (INDEPENDENT_AMBULATORY_CARE_PROVIDER_SITE_OTHER): Payer: Self-pay | Admitting: Infectious Diseases

## 2023-10-19 ENCOUNTER — Other Ambulatory Visit: Payer: Self-pay

## 2023-10-19 ENCOUNTER — Other Ambulatory Visit (HOSPITAL_COMMUNITY): Payer: Self-pay

## 2023-10-19 ENCOUNTER — Encounter: Payer: Self-pay | Admitting: Infectious Diseases

## 2023-10-19 ENCOUNTER — Other Ambulatory Visit: Payer: Self-pay | Admitting: Pharmacist

## 2023-10-19 ENCOUNTER — Other Ambulatory Visit (HOSPITAL_COMMUNITY)
Admission: RE | Admit: 2023-10-19 | Discharge: 2023-10-19 | Disposition: A | Discharge status: Home / Self Care | Source: Ambulatory Visit | Attending: Infectious Diseases | Admitting: Infectious Diseases

## 2023-10-19 VITALS — BP 129/79 | HR 79 | Temp 97.1°F | Ht 69.0 in | Wt 205.0 lb

## 2023-10-19 DIAGNOSIS — J45909 Unspecified asthma, uncomplicated: Secondary | ICD-10-CM | POA: Diagnosis not present

## 2023-10-19 DIAGNOSIS — Z113 Encounter for screening for infections with a predominantly sexual mode of transmission: Secondary | ICD-10-CM

## 2023-10-19 DIAGNOSIS — Z8619 Personal history of other infectious and parasitic diseases: Secondary | ICD-10-CM

## 2023-10-19 DIAGNOSIS — Z21 Asymptomatic human immunodeficiency virus [HIV] infection status: Secondary | ICD-10-CM

## 2023-10-19 DIAGNOSIS — B20 Human immunodeficiency virus [HIV] disease: Secondary | ICD-10-CM | POA: Diagnosis not present

## 2023-10-19 DIAGNOSIS — J301 Allergic rhinitis due to pollen: Secondary | ICD-10-CM

## 2023-10-19 MED ORDER — ALBUTEROL SULFATE HFA 108 (90 BASE) MCG/ACT IN AERS
2.0000 | INHALATION_SPRAY | RESPIRATORY_TRACT | 3 refills | Status: AC | PRN
Start: 2023-10-19 — End: ?
  Filled 2023-10-19 – 2023-10-22 (×2): qty 6.7, 25d supply, fill #0
  Filled 2023-10-26: qty 6.7, 30d supply, fill #0

## 2023-10-19 MED ORDER — BICTEGRAVIR-EMTRICITAB-TENOFOV 50-200-25 MG PO TABS
1.0000 | ORAL_TABLET | Freq: Every day | ORAL | 11 refills | Status: AC
  Filled 2023-10-19 – 2023-10-22 (×3): qty 30, 30d supply, fill #0
  Filled 2023-11-21: qty 30, 30d supply, fill #1
  Filled 2023-12-17: qty 30, 30d supply, fill #2
  Filled 2024-02-11: qty 30, 30d supply, fill #3
  Filled 2024-03-05 – 2024-04-30 (×2): qty 30, 30d supply, fill #4
  Filled 2024-05-27: qty 30, 30d supply, fill #5
  Filled 2024-06-19 – 2024-07-16 (×2): qty 30, 30d supply, fill #6
  Filled 2024-08-11: qty 30, 30d supply, fill #7

## 2023-10-19 NOTE — Progress Notes (Signed)
 Specialty Pharmacy Initiation Note   Jackson [name] is a 23 y.o. male who will be followed by the specialty pharmacy service for RxSp HIV    Review of administration, indication, effectiveness, safety, potential side effects, storage/disposable, and missed dose instructions occurred today for patient's specialty medication(s) Bictegravir-Emtricitab-Tenofov [facility])     Patient/Caregiver did not have any additional questions or concerns.   Patient's therapy is appropriate to: Continue    Goals Addressed             This Visit's Progress    Achieve Undetectable HIV Viral Load < 20       Patient is on track. Patient will maintain adherence         Jennette Kettle Specialty Pharmacist

## 2023-10-19 NOTE — Patient Instructions (Signed)
 So nice to see you!   Refills for your biktarvy and albuterol sent to [facility]   Would like to see you back 4-6 months

## 2023-10-19 NOTE — Progress Notes (Signed)
 Name: DOREN KASPAR  DOB: 12/02/2000  MRN: [id number] Patient, No Pcp Per     Brief Narrative:  Jackson [name] is a 23 y.o. AA male with HIV infection. Originally diagnosed in 07/19/2017 after seeking STI testing. CD4 nadir 570. History of OIs: none. HIV Risk: MSM.   Previous Regimens:  Biktarvy 2019 Pifeltro + Descovy 2022 (ACTG Trial)  Biktarvy 2023  Genotype:  07/2017 - no mutations     Subjective   CC: Routine follow up care.     Discussed the use of AI scribe software for clinical note transcription with the patient, who gave verbal consent to proceed.  History of Present Illness   The patient is a 23 year old with asthma and HIV who presents for a refill of his inhaler and follow-up on Biktarvy use.  He is currently on Biktarvy for HIV management and is doing well with no missed doses or side effects. He has experienced weight fluctuations, which have been consistent even before starting Biktarvy. His weight has been stable around 205 pounds since July 2024. He engages in regular physical activity, including walking and weightlifting, and has a supportive workout routine with a friend.  He requires a refill of his albuterol inhaler. He is unsure where the prescription should be sent due to a recent change in insurance, but mentions that his mother previously used a Administrator, sports. He is open to having the inhaler mailed to him and considers [facility] [employer] as an option.  He discusses his social life, mentioning casual but not romantic relationships, and is content with focusing on himself and enjoying his own company. No specific sexual health concerns today aside from general screenings.       Review of Systems  Constitutional:  Negative for chills and fever.  HENT:  Negative for sore throat.   Eyes:  Negative for visual disturbance.  Gastrointestinal:  Negative for abdominal pain, anal bleeding and rectal pain.  Genitourinary:  Negative for dysuria, genital sores,  penile discharge, penile pain, scrotal swelling and testicular pain.  Musculoskeletal:  Negative for arthralgias and joint swelling.  Skin:  Negative for rash.  Neurological:  Negative for headaches.  Hematological:  Negative for adenopathy.      Past Medical History:  Diagnosis Date   Asthma    HIV infection (HCC)     Outpatient Medications Prior to Visit  Medication Sig Dispense Refill   cetirizine (ZYRTEC) 10 MG tablet Take 10 mg by mouth daily.     fluticasone (FLOVENT HFA) 44 MCG/ACT inhaler Inhale 2 puffs into the lungs 2 (two) times daily. For 1-2 weeks or until breathing returns to baseline. 1 Inhaler 3   ipratropium (ATROVENT) 0.03 % nasal spray Place 2 sprays into both nostrils 2 (two) times daily. As needed 30 mL 12   albuterol (VENTOLIN HFA) 108 (90 Base) MCG/ACT inhaler Inhale 2 puffs into the lungs every 4 (four) hours as needed for wheezing or shortness of breath. 18 g 3   bictegravir-emtricitabine-tenofovir AF (BIKTARVY) 50-200-25 MG TABS tablet Take 1 tablet by mouth daily. Try to take at the same time each day with or without food. 30 tablet 4   No facility-administered medications prior to visit.   Allergies  Allergen Reactions   Amoxicillin Hives    Social History   Tobacco Use   Smoking status: Never   Smokeless tobacco: Never  Vaping Use   Vaping status: Never Used  Substance Use Topics   Alcohol use: Yes  Comment: OCC   Drug use: Yes    Frequency: 7.0 times per week    Types: Marijuana    Social History   Substance and Sexual Activity  Sexual Activity Not Currently   Partners: Female, Male   Birth control/protection: Condom   Comment: accepted condoms      Objective   Vitals:   10/19/23 0859  BP: 129/79  Pulse: 79  Temp: (!) 97.1 F (36.2 C)  TempSrc: Temporal  Weight: 205 lb (93 kg)  Height: 5\' 9"  (1.753 m)    Body mass index is 30.27 kg/m.  Physical Exam Constitutional:      Appearance: Normal appearance. He is not  ill-appearing.  HENT:     Head: Normocephalic.     Mouth/Throat:     Mouth: Mucous membranes are moist.     Pharynx: Oropharynx is clear.  Eyes:     General: No scleral icterus. Pulmonary:     Effort: Pulmonary effort is normal.  Musculoskeletal:        General: Normal range of motion.     Cervical back: Normal range of motion.  Skin:    Coloration: Skin is not jaundiced or pale.  Neurological:     Mental Status: He is alert and oriented to person, place, and time.  Psychiatric:        Mood and Affect: Mood normal.        Judgment: Judgment normal.      Lab Results Lab Results  Component Value Date   WBC 7.0 10/25/2020   HGB 15.7 10/25/2020   HCT 46 10/25/2020   MCV 87.4 09/20/2020   PLT 253 10/25/2020    Lab Results  Component Value Date   CREATININE 1.09 03/12/2023   BUN 10 03/12/2023   NA 136 03/12/2023   K 4.2 03/12/2023   CL 103 03/12/2023   CO2 25 03/12/2023    Lab Results  Component Value Date   ALT 34 03/12/2023   AST 38 03/12/2023   BILITOT 0.8 03/12/2023    Lab Results  Component Value Date   CHOL 142 03/12/2023   HDL 44 03/12/2023   LDLCALC 82 03/12/2023   TRIG 84 03/12/2023   CHOLHDL 3.2 03/12/2023   HIV 1 RNA Quant  Date Value  03/12/2023 Not Detected Copies/mL  04/03/2022 Not Detected Copies/mL  09/20/2021 <40   CD4 T Cell Abs  Date Value  03/12/2023 728 /uL  04/03/2022 844 /uL  10/22/2020 764      ASSESSMENT & PLAN:    HIV - Stable on Biktarvy with no reported side effects or missed doses. No sexual health concerns - requesting asymptomatic screening today. Copay card instructions provided and will send rx to [facility] pharmacy for mail fills.  -Continue Biktarvy as prescribed. -Perform routine syphilis test and viral load check. -No concerns over mood.   Asthma -  Appears well controlled with PRN use. Request for albuterol inhaler refill. -Send albuterol inhaler prescription to [facility] for mail order.   Weight  Management - Patient reports consistent weight around 205 lbs since July 2024. Expressed satisfaction with current weight and is actively working on maintaining and gaining muscle mass. -Encourage continued physical activity and healthy eating habits.      Meds ordered this encounter  Medications   albuterol (VENTOLIN HFA) 108 (90 Base) MCG/ACT inhaler    Sig: Inhale 2 puffs into the lungs every 4 (four) hours as needed for wheezing or shortness of breath.    Dispense:  51 g    Refill:  3    Patient would like to have this mailed please   bictegravir-emtricitabine-tenofovir AF (BIKTARVY) 50-200-25 MG TABS tablet    Sig: Take 1 tablet by mouth daily. Try to take at the same time each day with or without food.    Dispense:  30 tablet    Refill:  11    Patient would like to have this mail    Prescription Type::   Renewal   Orders Placed This Encounter  Procedures   RPR   HIV 1 RNA quant-no reflex-bld   COMPLETE METABOLIC PANEL WITH GFR   CBC w/Diff   T-helper cells (CD4) count    Return in about 6 months (around 04/20/2024).   Rexene Alberts, MSN, NP-C [facility] Pager: [phone]  10/19/2023

## 2023-10-19 NOTE — Progress Notes (Signed)
 Specialty Pharmacy Initial Fill Coordination Note  Jackson [name] is a 23 y.o. male contacted today regarding initial fill of specialty medication(s) Bictegravir-Emtricitab-Tenofov Susanne Borders)   Patient requested Delivery   Delivery date: 10/23/23   Verified address: [address] [place] [address]   Medication will be filled on 10/22/23.   Patient is aware of $0 copayment.

## 2023-10-22 ENCOUNTER — Other Ambulatory Visit: Payer: Self-pay

## 2023-10-22 ENCOUNTER — Other Ambulatory Visit (HOSPITAL_COMMUNITY): Payer: Self-pay

## 2023-10-22 LAB — COMPLETE METABOLIC PANEL WITH GFR
AG Ratio: 1.3 (calc) (ref 1.0–2.5)
ALT: 32 U/L (ref 9–46)
AST: 23 U/L (ref 10–40)
Albumin: 4.5 g/dL (ref 3.6–5.1)
Alkaline phosphatase (APISO): 72 U/L (ref 36–130)
BUN: 13 mg/dL (ref 7–25)
CO2: 24 mmol/L (ref 20–32)
Calcium: 9.4 mg/dL (ref 8.6–10.3)
Chloride: 106 mmol/L (ref 98–110)
Creat: 1.05 mg/dL (ref 0.60–1.24)
Globulin: 3.4 g/dL (ref 1.9–3.7)
Glucose, Bld: 104 mg/dL — ABNORMAL HIGH (ref 65–99)
Potassium: 3.9 mmol/L (ref 3.5–5.3)
Sodium: 137 mmol/L (ref 135–146)
Total Bilirubin: 0.6 mg/dL (ref 0.2–1.2)
Total Protein: 7.9 g/dL (ref 6.1–8.1)
eGFR: 102 mL/min/{1.73_m2} (ref >=60)

## 2023-10-22 LAB — CBC WITH DIFFERENTIAL/PLATELET
Absolute Lymphocytes: 2250 {cells}/uL (ref 850–3900)
Absolute Monocytes: 681 {cells}/uL (ref 200–950)
Basophils Absolute: 30 {cells}/uL (ref 0–200)
Basophils Relative: 0.4 %
Eosinophils Absolute: 37 {cells}/uL (ref 15–500)
Eosinophils Relative: 0.5 %
HCT: 43 % (ref 38.5–50.0)
Hemoglobin: 14.9 g/dL (ref 13.2–17.1)
MCH: 30.2 pg (ref 27.0–33.0)
MCHC: 34.7 g/dL (ref 32.0–36.0)
MCV: 87.2 fL (ref 80.0–100.0)
MPV: 10.3 fL (ref 7.5–12.5)
Monocytes Relative: 9.2 %
Neutro Abs: 4403 {cells}/uL (ref 1500–7800)
Neutrophils Relative %: 59.5 %
Platelets: 299 10*3/uL (ref 140–400)
RBC: 4.93 10*6/uL (ref 4.20–5.80)
RDW: 13 % (ref 11.0–15.0)
Total Lymphocyte: 30.4 %
WBC: 7.4 10*3/uL (ref 3.8–10.8)

## 2023-10-22 LAB — CYTOLOGY, (ORAL, ANAL, URETHRAL) ANCILLARY ONLY
Chlamydia: NEGATIVE
Comment: NEGATIVE
Comment: NORMAL
Neisseria Gonorrhea: NEGATIVE

## 2023-10-22 LAB — T-HELPER CELLS (CD4) COUNT (NOT AT ARMC)
Absolute CD4: 771 {cells}/uL (ref 490–1740)
CD4 T Helper %: 34 % (ref 30–61)
Total lymphocyte count: 2243 {cells}/uL (ref 850–3900)

## 2023-10-22 LAB — T PALLIDUM AB: T Pallidum Abs: POSITIVE — AB

## 2023-10-22 LAB — URINE CYTOLOGY ANCILLARY ONLY
Chlamydia: NEGATIVE
Comment: NEGATIVE
Comment: NEGATIVE
Comment: NORMAL
Neisseria Gonorrhea: NEGATIVE
Trichomonas: NEGATIVE

## 2023-10-22 LAB — HIV-1 RNA QUANT-NO REFLEX-BLD
HIV 1 RNA Quant: NOT DETECTED {copies}/mL
HIV-1 RNA Quant, Log: NOT DETECTED {Log_copies}/mL

## 2023-10-22 LAB — RPR: RPR Ser Ql: REACTIVE — AB

## 2023-10-22 LAB — RPR TITER: RPR Titer: 1:1 {titer} — ABNORMAL HIGH

## 2023-10-23 ENCOUNTER — Other Ambulatory Visit: Payer: Self-pay

## 2023-10-23 ENCOUNTER — Encounter: Payer: Self-pay | Admitting: Pharmacist

## 2023-10-26 ENCOUNTER — Other Ambulatory Visit (HOSPITAL_COMMUNITY): Payer: Self-pay

## 2023-10-26 ENCOUNTER — Other Ambulatory Visit: Payer: Self-pay

## 2023-11-08 ENCOUNTER — Other Ambulatory Visit: Payer: Self-pay

## 2023-11-12 ENCOUNTER — Other Ambulatory Visit (HOSPITAL_COMMUNITY): Payer: Self-pay

## 2023-11-14 ENCOUNTER — Other Ambulatory Visit (HOSPITAL_COMMUNITY): Payer: Self-pay

## 2023-11-15 ENCOUNTER — Other Ambulatory Visit (HOSPITAL_COMMUNITY): Payer: Self-pay

## 2023-11-21 ENCOUNTER — Other Ambulatory Visit: Payer: Self-pay

## 2023-11-21 ENCOUNTER — Other Ambulatory Visit (HOSPITAL_COMMUNITY): Payer: Self-pay

## 2023-11-21 NOTE — Progress Notes (Signed)
 Specialty Pharmacy Refill Coordination Note  Jackson [name] is a 23 y.o. male contacted today regarding refills of specialty medication(s) Bictegravir-Emtricitab-Tenofov Susanne Borders)   Patient requested Delivery   Delivery date: 11/23/23   Verified address: [address] [place] [address]   Medication will be filled on 11/22/23.

## 2023-11-22 ENCOUNTER — Other Ambulatory Visit: Payer: Self-pay

## 2023-12-10 ENCOUNTER — Other Ambulatory Visit: Payer: Self-pay

## 2023-12-13 ENCOUNTER — Other Ambulatory Visit: Payer: Self-pay

## 2023-12-17 ENCOUNTER — Other Ambulatory Visit (HOSPITAL_COMMUNITY): Payer: Self-pay

## 2023-12-17 ENCOUNTER — Other Ambulatory Visit: Payer: Self-pay

## 2023-12-17 NOTE — Progress Notes (Signed)
 Specialty Pharmacy Refill Coordination Note  Jackson [name] is a 23 y.o. male contacted today regarding refills of specialty medication(s) Bictegravir-Emtricitab-Tenofov (BIKTARVY )   Patient requested Delivery   Delivery date: 12/24/23   Verified address: [address] [place] [address]   Medication will be filled on 12/21/23.

## 2023-12-21 ENCOUNTER — Other Ambulatory Visit: Payer: Self-pay

## 2024-01-01 ENCOUNTER — Other Ambulatory Visit: Payer: Self-pay

## 2024-01-09 ENCOUNTER — Other Ambulatory Visit: Payer: Self-pay

## 2024-01-14 ENCOUNTER — Other Ambulatory Visit: Payer: Self-pay

## 2024-01-16 ENCOUNTER — Other Ambulatory Visit: Payer: Self-pay

## 2024-02-11 ENCOUNTER — Other Ambulatory Visit: Payer: Self-pay

## 2024-02-11 ENCOUNTER — Other Ambulatory Visit (HOSPITAL_COMMUNITY): Payer: Self-pay

## 2024-02-11 NOTE — Progress Notes (Signed)
 Specialty Pharmacy Refill Coordination Note  Jackson [name] is a 23 y.o. male contacted today regarding refills of specialty medication(s) Bictegravir-Emtricitab-Tenofov (BIKTARVY )   Patient requested Delivery   Delivery date: 02/13/24   Verified address: [address] [place] [address]   Medication will be filled on 02/12/24.

## 2024-03-05 ENCOUNTER — Other Ambulatory Visit (HOSPITAL_COMMUNITY): Payer: Self-pay

## 2024-03-07 ENCOUNTER — Other Ambulatory Visit: Payer: Self-pay

## 2024-04-03 ENCOUNTER — Other Ambulatory Visit: Payer: Self-pay

## 2024-04-07 ENCOUNTER — Other Ambulatory Visit: Payer: Self-pay

## 2024-04-28 ENCOUNTER — Other Ambulatory Visit (HOSPITAL_COMMUNITY): Payer: Self-pay

## 2024-04-30 ENCOUNTER — Other Ambulatory Visit: Payer: Self-pay

## 2024-04-30 ENCOUNTER — Other Ambulatory Visit: Payer: Self-pay | Admitting: Pharmacy Technician

## 2024-04-30 NOTE — Progress Notes (Signed)
 Specialty Pharmacy Refill Coordination Note  Jackson [name] is a 23 y.o. male contacted today regarding refills of specialty medication(s) Bictegravir-Emtricitab-Tenofov (BIKTARVY )   Patient requested Delivery   Delivery date: 05/02/24   Verified address: [address] [place] [address]   Medication will be filled on 05/01/24.

## 2024-05-19 ENCOUNTER — Other Ambulatory Visit (HOSPITAL_COMMUNITY): Payer: Self-pay

## 2024-05-21 ENCOUNTER — Other Ambulatory Visit: Payer: Self-pay

## 2024-05-22 ENCOUNTER — Other Ambulatory Visit: Payer: Self-pay

## 2024-05-27 ENCOUNTER — Other Ambulatory Visit (HOSPITAL_COMMUNITY): Payer: Self-pay

## 2024-05-27 ENCOUNTER — Other Ambulatory Visit: Payer: Self-pay

## 2024-05-27 NOTE — Progress Notes (Signed)
 Specialty Pharmacy Refill Coordination Note  Spoke with Carlin DELENA [place]  ASHAZ ROBLING is a 23 y.o. male contacted today regarding refills of specialty medication(s) Bictegravir-Emtricitab-Tenofov (BIKTARVY )  Doses on hand: 7  Patient requested: Delivery   Delivery date: 05/29/24   Verified address: [address] [place] [address]  Medication will be filled on 05/28/24.

## 2024-06-19 ENCOUNTER — Other Ambulatory Visit: Payer: Self-pay

## 2024-06-23 ENCOUNTER — Other Ambulatory Visit: Payer: Self-pay

## 2024-06-24 ENCOUNTER — Other Ambulatory Visit: Payer: Self-pay

## 2024-06-26 ENCOUNTER — Other Ambulatory Visit: Payer: Self-pay

## 2024-07-16 ENCOUNTER — Other Ambulatory Visit: Payer: Self-pay

## 2024-07-16 NOTE — Progress Notes (Signed)
 Specialty Pharmacy Refill Coordination Note  Jackson [name] is a 23 y.o. male contacted today regarding refills of specialty medication(s) Bictegravir-Emtricitab-Tenofov (BIKTARVY )   Patient requested Delivery   Delivery date: 07/18/24   Verified address: [address] [place] [address] [place] [address]   Medication will be filled on: 07/17/24

## 2024-07-16 NOTE — Progress Notes (Signed)
 Specialty Pharmacy Ongoing Clinical Assessment Note  Jackson [name] is a 23 y.o. male who is being followed by the specialty pharmacy service for RxSp HIV   Patient's specialty medication(s) reviewed today: Bictegravir-Emtricitab-Tenofov (BIKTARVY )   Missed doses in the last 4 weeks: 0   Patient/Caregiver did not have any additional questions or concerns.   Therapeutic benefit summary: Patient is achieving benefit   Adverse events/side effects summary: No adverse events/side effects   Patient's therapy is appropriate to: Continue    Goals Addressed             This Visit's Progress    Achieve Undetectable HIV Viral Load < 20   On track    Patient is on track. Patient will maintain adherence         Follow up: 12 months  [facility]

## 2024-08-06 ENCOUNTER — Other Ambulatory Visit: Payer: Self-pay

## 2024-08-11 ENCOUNTER — Other Ambulatory Visit (HOSPITAL_COMMUNITY): Payer: Self-pay

## 2024-08-13 ENCOUNTER — Other Ambulatory Visit: Payer: Self-pay

## 2024-08-15 ENCOUNTER — Other Ambulatory Visit: Payer: Self-pay

## 2024-08-26 ENCOUNTER — Other Ambulatory Visit: Payer: Self-pay
